# Patient Record
Sex: Female | Born: 1945 | Race: White | Hispanic: No | Marital: Married | Smoking: Former smoker
Health system: Southern US, Community
[De-identification: ages and names within clinical notes are randomized; demographics above are authoritative.]

## PROBLEM LIST (undated history)

## (undated) DIAGNOSIS — E079 Disorder of thyroid, unspecified: Secondary | ICD-10-CM

## (undated) DIAGNOSIS — I10 Essential (primary) hypertension: Secondary | ICD-10-CM

## (undated) HISTORY — DX: Disorder of thyroid, unspecified: E07.9

## (undated) HISTORY — PX: ABDOMINAL HYSTERECTOMY: SHX81

## (undated) HISTORY — PX: OTHER SURGICAL HISTORY: SHX169

## (undated) HISTORY — DX: Essential (primary) hypertension: I10

---

## 1966-03-31 HISTORY — PX: OTHER SURGICAL HISTORY: SHX169

## 1999-05-22 ENCOUNTER — Other Ambulatory Visit: Admission: RE | Admit: 1999-05-22 | Discharge: 1999-05-22 | Payer: Self-pay | Admitting: Urology

## 2013-05-15 ENCOUNTER — Encounter: Payer: Self-pay | Admitting: *Deleted

## 2013-05-15 DIAGNOSIS — E785 Hyperlipidemia, unspecified: Secondary | ICD-10-CM | POA: Insufficient documentation

## 2013-05-15 DIAGNOSIS — R0600 Dyspnea, unspecified: Secondary | ICD-10-CM | POA: Insufficient documentation

## 2013-05-15 DIAGNOSIS — I10 Essential (primary) hypertension: Secondary | ICD-10-CM | POA: Insufficient documentation

## 2013-05-15 DIAGNOSIS — E039 Hypothyroidism, unspecified: Secondary | ICD-10-CM | POA: Insufficient documentation

## 2013-05-15 DIAGNOSIS — H8109 Meniere's disease, unspecified ear: Secondary | ICD-10-CM

## 2015-02-16 ENCOUNTER — Encounter: Payer: Self-pay | Admitting: Gastroenterology

## 2015-04-06 ENCOUNTER — Ambulatory Visit (AMBULATORY_SURGERY_CENTER): Payer: Self-pay

## 2015-04-06 VITALS — Ht 61.5 in | Wt 176.0 lb

## 2015-04-06 DIAGNOSIS — Z1211 Encounter for screening for malignant neoplasm of colon: Secondary | ICD-10-CM

## 2015-04-06 NOTE — Progress Notes (Signed)
Per pt, no allergies to soy or egg products.Pt not taking any weight loss meds or using  O2 at home. 

## 2015-04-19 ENCOUNTER — Ambulatory Visit (AMBULATORY_SURGERY_CENTER): Payer: 59 | Admitting: Gastroenterology

## 2015-04-19 ENCOUNTER — Encounter: Payer: Self-pay | Admitting: Gastroenterology

## 2015-04-19 VITALS — BP 114/66 | HR 73 | Temp 98.1°F | Resp 14 | Ht 61.5 in | Wt 176.0 lb

## 2015-04-19 DIAGNOSIS — K621 Rectal polyp: Secondary | ICD-10-CM

## 2015-04-19 DIAGNOSIS — D122 Benign neoplasm of ascending colon: Secondary | ICD-10-CM

## 2015-04-19 DIAGNOSIS — Z1211 Encounter for screening for malignant neoplasm of colon: Secondary | ICD-10-CM

## 2015-04-19 DIAGNOSIS — D128 Benign neoplasm of rectum: Secondary | ICD-10-CM

## 2015-04-19 DIAGNOSIS — D129 Benign neoplasm of anus and anal canal: Secondary | ICD-10-CM

## 2015-04-19 MED ORDER — SODIUM CHLORIDE 0.9 % IV SOLN: 500.0000 mL | INTRAVENOUS | Status: DC

## 2015-04-19 NOTE — Patient Instructions (Signed)
Impressions/recommendations:  Polyps (handout given)  Repeat colonoscopy pending pathology results.  YOU HAD AN ENDOSCOPIC PROCEDURE TODAY AT THE [facility]:   Refer to the procedure report that was given to you for any specific questions about what was found during the examination.  If the procedure report does not answer your questions, please call your gastroenterologist to clarify.  If you requested that your care partner not be given the details of your procedure findings, then the procedure report has been included in a sealed envelope for you to review at your convenience later.  YOU SHOULD EXPECT: Some feelings of bloating in the abdomen. Passage of more gas than usual.  Walking can help get rid of the air that was put into your GI tract during the procedure and reduce the bloating. If you had a lower endoscopy (such as a colonoscopy or flexible sigmoidoscopy) you may notice spotting of blood in your stool or on the toilet paper. If you underwent a bowel prep for your procedure, you may not have a normal bowel movement for a few days.  Please Note:  You might notice some irritation and congestion in your nose or some drainage.  This is from the oxygen used during your procedure.  There is no need for concern and it should clear up in a day or so.  SYMPTOMS TO REPORT IMMEDIATELY:   Following lower endoscopy (colonoscopy or flexible sigmoidoscopy):  Excessive amounts of blood in the stool  Significant tenderness or worsening of abdominal pains  Swelling of the abdomen that is new, acute  Fever of 100F or higher  For urgent or emergent issues, a gastroenterologist can be reached at any hour by calling [phone].   DIET: Your first meal following the procedure should be a small meal and then it is ok to progress to your normal diet. Heavy or fried foods are harder to digest and may make you feel nauseous or bloated.  Likewise, meals heavy in dairy and vegetables can  increase bloating.  Drink plenty of fluids but you should avoid alcoholic beverages for 24 hours.  ACTIVITY:  You should plan to take it easy for the rest of today and you should NOT DRIVE or use heavy machinery until tomorrow (because of the sedation medicines used during the test).    FOLLOW UP: Our staff will call the number listed on your records the next business day following your procedure to check on you and address any questions or concerns that you may have regarding the information given to you following your procedure. If we do not reach you, we will leave a message.  However, if you are feeling well and you are not experiencing any problems, there is no need to return our call.  We will assume that you have returned to your regular daily activities without incident.  If any biopsies were taken you will be contacted by phone or by letter within the next 1-3 weeks.  Please call us at [phone] if you have not heard about the biopsies in 3 weeks.    SIGNATURES/CONFIDENTIALITY: You and/or your care partner have signed paperwork which will be entered into your electronic medical record.  These signatures attest to the fact that that the information above on your After Visit Summary has been reviewed and is understood.  Full responsibility of the confidentiality of this discharge information lies with you and/or your care-partner. 

## 2015-04-19 NOTE — Op Note (Signed)
[facility]  [address] [place], [address]   COLONOSCOPY PROCEDURE REPORT  PATIENT: Marissa Shah, Marissa Shah  MR#: [id number] BIRTHDATE: 05-03-1945 , 69  yrs. old GENDER: female ENDOSCOPIST: Ladene Artist, MD, [facility] REFERRED BY: Fredrich Romans, MD PROCEDURE DATE:  04/19/2015 PROCEDURE:   Colonoscopy, screening and Colonoscopy with snare polypectomy First Screening Colonoscopy - Avg.  risk and is 50 yrs.  old or older Yes.  Prior Negative Screening - Now for repeat screening. N/A  History of Adenoma - Now for follow-up colonoscopy & has been > or = to 3 yrs.  N/A  Polyps removed today? Yes ASA CLASS:   Class II INDICATIONS:Screening for colonic neoplasia and Colorectal Neoplasm Risk Assessment for this procedure is average risk. MEDICATIONS: Monitored anesthesia care, Propofol 200 mg IV, and lidocaine 40 mg IV DESCRIPTION OF PROCEDURE:   After the risks benefits and alternatives of the procedure were thoroughly explained, informed consent was obtained.  The digital rectal exam revealed no abnormalities of the rectum.   The LB [id number]  endoscope was introduced through the anus and advanced to the cecum, which was identified by both the appendix and ileocecal valve. No adverse events experienced.   The quality of the prep was excellent. (Suprep was used)  The instrument was then slowly withdrawn as the colon was fully examined. Estimated blood loss is zero unless otherwise noted in this procedure report.    COLON FINDINGS: Two sessile polyps measuring 5-7 mm in size were found in the rectum (72mm) and ascending colon (40mm).  Polypectomies were performed with a cold snare.  The resection was complete, the polyp tissue was completely retrieved and sent to histology.   The examination was otherwise normal.  Retroflexed views revealed no abnormalities. The time to cecum = 1.4 Withdrawal time = 9.8   The scope was withdrawn and the procedure  completed. COMPLICATIONS: There were no immediate complications.  ENDOSCOPIC IMPRESSION: 1.   Two sessile polyps in the rectum and ascending colon; polypectomies performed with a cold snare 2.   The examination was otherwise normal  RECOMMENDATIONS: 1.  Await pathology results 2.  Repeat colonoscopy in 5 years if polyp(s) adenomatous; otherwise 10 years  eSigned:  Ladene Artist, MD, [facility] 04/19/2015 8:38 AM

## 2015-04-19 NOTE — Progress Notes (Signed)
Stable to RR 

## 2015-04-19 NOTE — Progress Notes (Signed)
Called to room to assist during endoscopic procedure.  Patient ID and intended procedure confirmed with present staff. Received instructions for my participation in the procedure from the performing physician.  

## 2015-04-20 ENCOUNTER — Telehealth: Payer: Self-pay | Admitting: *Deleted

## 2015-04-20 NOTE — Telephone Encounter (Signed)
  Follow up Call-  Call back number 04/19/2015  Post procedure Call Back phone  # [phone]  Permission to leave phone message Yes     Patient questions:  Message left to call us if necessary.

## 2015-05-01 ENCOUNTER — Encounter: Payer: Self-pay | Admitting: Gastroenterology

## 2019-02-04 ENCOUNTER — Other Ambulatory Visit: Payer: Self-pay

## 2019-02-04 DIAGNOSIS — Z20822 Contact with and (suspected) exposure to covid-19: Secondary | ICD-10-CM

## 2019-02-05 LAB — NOVEL CORONAVIRUS, NAA: SARS-CoV-2, NAA: NOT DETECTED

## 2019-04-25 ENCOUNTER — Encounter: Payer: Self-pay | Admitting: Family Medicine

## 2019-04-25 ENCOUNTER — Other Ambulatory Visit: Payer: Self-pay

## 2019-04-25 ENCOUNTER — Ambulatory Visit (INDEPENDENT_AMBULATORY_CARE_PROVIDER_SITE_OTHER): Payer: PPO | Admitting: Family Medicine

## 2019-04-25 VITALS — BP 140/70 | HR 95 | Temp 98.5°F | Ht 61.5 in | Wt 174.5 lb

## 2019-04-25 DIAGNOSIS — I10 Essential (primary) hypertension: Secondary | ICD-10-CM

## 2019-04-25 DIAGNOSIS — Z131 Encounter for screening for diabetes mellitus: Secondary | ICD-10-CM | POA: Diagnosis not present

## 2019-04-25 DIAGNOSIS — E039 Hypothyroidism, unspecified: Secondary | ICD-10-CM

## 2019-04-25 DIAGNOSIS — E785 Hyperlipidemia, unspecified: Secondary | ICD-10-CM | POA: Diagnosis not present

## 2019-04-25 MED ORDER — BISOPROLOL-HYDROCHLOROTHIAZIDE 2.5-6.25 MG PO TABS: 1.0000 | ORAL_TABLET | Freq: Every day | ORAL | 1 refills | Status: DC

## 2019-04-25 NOTE — Progress Notes (Signed)
Marissa Shah DOB: [birth year] Encounter date: 04/25/2019  This is a 74 y.o. female who presents to establish care. Chief Complaint  Patient presents with  . Establish Care    History of present illness: Had Dr. Sheppard Coil and changed insurance so ended up here where husband goes.   Has been very fortunate and not been to doctor since last December.   Has put on weight. Not getting enough exercise. Gets out of breath easily. Not been active. Gets short of breath. Would love to lose 40lbs. Always wondered if thyroid medication is right for her. Had thyroid removed for goiter in her 20's. Was very skinny until her 58's. Feels like breathing has changed as she has gained weight and become more sedentary. Least exertion makes her short of breath. If she takes deep breath she is fine. Once in awhile feels like heart is racing a bit. Had stress test in past; not sure how long ago.   Has meniere's but if tries to dry up sinus then she gets unbalanced and then affects her hearing and really tries to avoid Meniere's flare. Last flare up for her was about 4 years ago. Can tell when it is coming on. Tries to avoid caffeine, watch salt intake/water retention to help.   HTN: states elevated when she got here. Phone dead, wasn't sure how to get in without being able to call. Has been out of her medication (bisoprolol-hctz) for 3 weeks.  Hypothyroid: has been on synthroid long term.   Hyperlipidemia: was switched from lipitor to crestor.     Past Medical History:  Diagnosis Date  . Hypertension   . Thyroid disease    Past Surgical History:  Procedure Laterality Date  . ABDOMINAL HYSTERECTOMY    . broken arm     left wrist/has screws and plates  . Goiter  1968   removed   Allergies  Allergen Reactions  . Sulfur     Burning in throat!   Current Meds  Medication Sig  . atorvastatin (LIPITOR) 20 MG tablet Take 20 mg by mouth daily.  . bisoprolol-hydrochlorothiazide (ZIAC) 2.5-6.25 MG  tablet Take 1 tablet by mouth daily.  . Calcium Carbonate-Vitamin D (CALTRATE 600+D PO) Take by mouth daily.  Marland Kitchen levothyroxine (SYNTHROID, LEVOTHROID) 88 MCG tablet Take 88 mcg by mouth daily before breakfast.  . lisinopril (PRINIVIL,ZESTRIL) 5 MG tablet Take 5 mg by mouth daily.  . [DISCONTINUED] bisoprolol-hydrochlorothiazide (ZIAC) 2.5-6.25 MG per tablet Take 1 tablet by mouth daily.   Social History   Tobacco Use  . Smoking status: Former Smoker    Quit date: 08/13/2006    Years since quitting: 12.7  . Smokeless tobacco: Never Used  Substance Use Topics  . Alcohol use: No    Alcohol/week: 0.0 standard drinks   Family History  Problem Relation Age of Onset  . Colon cancer Neg Hx      Review of Systems  Constitutional: Negative for chills, fatigue and fever.  Respiratory: Negative for cough, chest tightness, shortness of breath and wheezing.   Cardiovascular: Negative for chest pain, palpitations and leg swelling.    Objective:  BP 140/70 (BP Location: Left Arm, Patient Position: Sitting, Cuff Size: Large)   Pulse 95   Temp 98.5 F (36.9 C) (Temporal)   Ht 5' 1.5" (1.562 m)   Wt 174 lb 8 oz (79.2 kg)   SpO2 99%   BMI 32.44 kg/m   Weight: 174 lb 8 oz (79.2 kg)   BP Readings from Last 3  Encounters:  04/25/19 140/70  04/19/15 114/66   Wt Readings from Last 3 Encounters:  04/25/19 174 lb 8 oz (79.2 kg)  04/19/15 176 lb (79.8 kg)  04/06/15 176 lb (79.8 kg)    Physical Exam Constitutional:      General: She is not in acute distress.    Appearance: She is well-developed.  Cardiovascular:     Rate and Rhythm: Normal rate and regular rhythm.     Heart sounds: Normal heart sounds. No murmur. No friction rub.  Pulmonary:     Effort: Pulmonary effort is normal. No respiratory distress.     Breath sounds: Normal breath sounds. No wheezing or rales.  Musculoskeletal:     Right lower leg: No edema.     Left lower leg: No edema.  Neurological:     Mental Status: She  is alert and oriented to person, place, and time.  Psychiatric:        Behavior: Behavior normal.     Assessment/Plan:  1. Essential hypertension Blood pressure is elevated today, but she has been out of her Ziac.  She will restart this medication and we will see her in follow-up in about 1 month's time.  Blood work prior to next visit. - bisoprolol-hydrochlorothiazide (ZIAC) 2.5-6.25 MG tablet; Take 1 tablet by mouth daily.  Dispense: 90 tablet; Refill: 1 - CBC with Differential/Platelet; Future - Comprehensive metabolic panel; Future  2. Acquired hypothyroidism Continue with current Synthroid. - TSH; Future - T4, free; Future - T3, free; Future  3. Hyperlipidemia, unspecified hyperlipidemia type Continue with Crestor. - Lipid panel; Future  4. Screening for diabetes mellitus - Hemoglobin A1c; Future  Return in about 1 month (around 05/26/2019) for bloodwork prior to next appointment.  Micheline Rough, MD

## 2019-05-03 ENCOUNTER — Other Ambulatory Visit (INDEPENDENT_AMBULATORY_CARE_PROVIDER_SITE_OTHER): Payer: PPO

## 2019-05-03 ENCOUNTER — Other Ambulatory Visit: Payer: Self-pay

## 2019-05-03 DIAGNOSIS — Z131 Encounter for screening for diabetes mellitus: Secondary | ICD-10-CM | POA: Diagnosis not present

## 2019-05-03 DIAGNOSIS — E039 Hypothyroidism, unspecified: Secondary | ICD-10-CM | POA: Diagnosis not present

## 2019-05-03 DIAGNOSIS — I10 Essential (primary) hypertension: Secondary | ICD-10-CM

## 2019-05-03 DIAGNOSIS — E785 Hyperlipidemia, unspecified: Secondary | ICD-10-CM | POA: Diagnosis not present

## 2019-05-03 LAB — CBC WITH DIFFERENTIAL/PLATELET
Basophils Absolute: 0.1 10*3/uL (ref 0.0–0.1)
Basophils Relative: 0.6 % (ref 0.0–3.0)
Eosinophils Absolute: 0.2 10*3/uL (ref 0.0–0.7)
Eosinophils Relative: 2 % (ref 0.0–5.0)
HCT: 41.3 % (ref 36.0–46.0)
Hemoglobin: 14.3 g/dL (ref 12.0–15.0)
Lymphocytes Relative: 28.9 % (ref 12.0–46.0)
Lymphs Abs: 2.7 10*3/uL (ref 0.7–4.0)
MCHC: 34.6 g/dL (ref 30.0–36.0)
MCV: 92.1 fl (ref 78.0–100.0)
Monocytes Absolute: 0.6 10*3/uL (ref 0.1–1.0)
Monocytes Relative: 6.3 % (ref 3.0–12.0)
Neutro Abs: 5.8 10*3/uL (ref 1.4–7.7)
Neutrophils Relative %: 62.2 % (ref 43.0–77.0)
Platelets: 238 10*3/uL (ref 150.0–400.0)
RBC: 4.48 Mil/uL (ref 3.87–5.11)
RDW: 13.2 % (ref 11.5–15.5)
WBC: 9.3 10*3/uL (ref 4.0–10.5)

## 2019-05-03 LAB — LIPID PANEL
Cholesterol: 191 mg/dL (ref 0–200)
HDL: 47.4 mg/dL (ref >39.00)
LDL Cholesterol: 118 mg/dL — ABNORMAL HIGH (ref 0–99)
NonHDL: 143.54
Total CHOL/HDL Ratio: 4
Triglycerides: 129 mg/dL (ref 0.0–149.0)
VLDL: 25.8 mg/dL (ref 0.0–40.0)

## 2019-05-03 LAB — HEMOGLOBIN A1C: Hgb A1c MFr Bld: 5.9 % (ref 4.6–6.5)

## 2019-05-03 LAB — COMPREHENSIVE METABOLIC PANEL
ALT: 12 U/L (ref 0–35)
AST: 14 U/L (ref 0–37)
Albumin: 4.4 g/dL (ref 3.5–5.2)
Alkaline Phosphatase: 84 U/L (ref 39–117)
BUN: 19 mg/dL (ref 6–23)
CO2: 32 mEq/L (ref 19–32)
Calcium: 9.3 mg/dL (ref 8.4–10.5)
Chloride: 98 mEq/L (ref 96–112)
Creatinine, Ser: 0.77 mg/dL (ref 0.40–1.20)
GFR: 73.42 mL/min (ref >60.00)
Glucose, Bld: 108 mg/dL — ABNORMAL HIGH (ref 70–99)
Potassium: 4.8 mEq/L (ref 3.5–5.1)
Sodium: 137 mEq/L (ref 135–145)
Total Bilirubin: 0.5 mg/dL (ref 0.2–1.2)
Total Protein: 7.6 g/dL (ref 6.0–8.3)

## 2019-05-03 LAB — TSH: TSH: 2.4 u[IU]/mL (ref 0.35–4.50)

## 2019-05-03 LAB — T3, FREE: T3, Free: 3.4 pg/mL (ref 2.3–4.2)

## 2019-05-03 LAB — T4, FREE: Free T4: 0.98 ng/dL (ref 0.60–1.60)

## 2019-05-09 DIAGNOSIS — Z20822 Contact with and (suspected) exposure to covid-19: Secondary | ICD-10-CM | POA: Diagnosis not present

## 2019-05-09 DIAGNOSIS — Z20828 Contact with and (suspected) exposure to other viral communicable diseases: Secondary | ICD-10-CM | POA: Diagnosis not present

## 2019-06-09 ENCOUNTER — Other Ambulatory Visit: Payer: Self-pay

## 2019-06-10 ENCOUNTER — Encounter: Payer: Self-pay | Admitting: Family Medicine

## 2019-06-10 ENCOUNTER — Ambulatory Visit (INDEPENDENT_AMBULATORY_CARE_PROVIDER_SITE_OTHER): Payer: PPO | Admitting: Family Medicine

## 2019-06-10 VITALS — BP 118/60 | HR 74 | Temp 98.3°F | Ht 61.5 in | Wt 174.9 lb

## 2019-06-10 DIAGNOSIS — E039 Hypothyroidism, unspecified: Secondary | ICD-10-CM | POA: Diagnosis not present

## 2019-06-10 DIAGNOSIS — I10 Essential (primary) hypertension: Secondary | ICD-10-CM

## 2019-06-10 DIAGNOSIS — E785 Hyperlipidemia, unspecified: Secondary | ICD-10-CM

## 2019-06-10 MED ORDER — ROSUVASTATIN CALCIUM 10 MG PO TABS: 10.0000 mg | ORAL_TABLET | Freq: Every day | ORAL | 3 refills | Status: DC

## 2019-06-10 NOTE — Progress Notes (Signed)
  Marissa Shah DOB: 04/04/1945 Encounter date: 06/10/2019  This is a 74 y.o. female who presents with Chief Complaint  Patient presents with  . Follow-up    History of present illness: HTN: elevated last visit due to stress/out of medication. Doing well with medication.   Had COVID vaccines since last visit.   Allergies  Allergen Reactions  . Sulfur     Burning in throat!   Current Meds  Medication Sig  . bisoprolol-hydrochlorothiazide (ZIAC) 2.5-6.25 MG tablet Take 1 tablet by mouth daily.  . Calcium Carbonate-Vitamin D (CALTRATE 600+D PO) Take by mouth daily.  Marland Kitchen levothyroxine (SYNTHROID, LEVOTHROID) 88 MCG tablet Take 88 mcg by mouth daily before breakfast.  . lisinopril (PRINIVIL,ZESTRIL) 5 MG tablet Take 5 mg by mouth daily.  . [DISCONTINUED] atorvastatin (LIPITOR) 20 MG tablet Take 20 mg by mouth daily.    Review of Systems  Constitutional: Negative for chills, fatigue and fever.  Respiratory: Negative for cough, chest tightness, shortness of breath and wheezing.   Cardiovascular: Negative for chest pain, palpitations and leg swelling.    Objective:  BP 118/60 (BP Location: Left Arm, Patient Position: Sitting, Cuff Size: Normal)   Pulse 74   Temp 98.3 F (36.8 C) (Temporal)   Ht 5' 1.5" (1.562 m)   Wt 174 lb 14.4 oz (79.3 kg)   BMI 32.51 kg/m   Weight: 174 lb 14.4 oz (79.3 kg)   BP Readings from Last 3 Encounters:  06/10/19 118/60  04/25/19 140/70  04/19/15 114/66   Wt Readings from Last 3 Encounters:  06/10/19 174 lb 14.4 oz (79.3 kg)  04/25/19 174 lb 8 oz (79.2 kg)  04/19/15 176 lb (79.8 kg)    Physical Exam Constitutional:      General: She is not in acute distress.    Appearance: She is well-developed.  Cardiovascular:     Rate and Rhythm: Normal rate and regular rhythm.     Heart sounds: Normal heart sounds. No murmur. No friction rub.  Pulmonary:     Effort: Pulmonary effort is normal. No respiratory distress.     Breath sounds: Normal  breath sounds. No wheezing or rales.  Musculoskeletal:     Right lower leg: No edema.     Left lower leg: No edema.  Neurological:     Mental Status: She is alert and oriented to person, place, and time.  Psychiatric:        Behavior: Behavior normal.     Assessment/Plan  1. Hyperlipidemia, unspecified hyperlipidemia type Well controlled. Continue current medications.  - rosuvastatin (CRESTOR) 10 MG tablet; Take 1 tablet (10 mg total) by mouth daily.  Dispense: 90 tablet; Refill: 3  2. Essential hypertension Well controlled on current medications. Continue these.   3. Hypothyroidism, unspecified type Well controlled with recent bloodwork.     Return in about 6 months (around 12/11/2019) for physical exam.    Micheline Rough, MD

## 2019-10-18 ENCOUNTER — Other Ambulatory Visit: Payer: Self-pay | Admitting: Family Medicine

## 2019-10-18 DIAGNOSIS — I10 Essential (primary) hypertension: Secondary | ICD-10-CM

## 2019-12-12 ENCOUNTER — Telehealth: Payer: Self-pay | Admitting: Family Medicine

## 2019-12-12 ENCOUNTER — Other Ambulatory Visit: Payer: Self-pay | Admitting: Family Medicine

## 2019-12-12 ENCOUNTER — Ambulatory Visit: Payer: PPO | Admitting: Family Medicine

## 2019-12-12 DIAGNOSIS — E785 Hyperlipidemia, unspecified: Secondary | ICD-10-CM

## 2019-12-12 DIAGNOSIS — I10 Essential (primary) hypertension: Secondary | ICD-10-CM

## 2019-12-12 MED ORDER — ROSUVASTATIN CALCIUM 10 MG PO TABS: 10.0000 mg | ORAL_TABLET | Freq: Every day | ORAL | 0 refills | Status: DC

## 2019-12-12 MED ORDER — BISOPROLOL-HYDROCHLOROTHIAZIDE 2.5-6.25 MG PO TABS: 1.0000 | ORAL_TABLET | Freq: Every day | ORAL | 0 refills | Status: DC

## 2019-12-12 MED ORDER — LISINOPRIL 5 MG PO TABS: 5.0000 mg | ORAL_TABLET | Freq: Every day | ORAL | 0 refills | Status: DC

## 2019-12-12 MED ORDER — LEVOTHYROXINE SODIUM 88 MCG PO TABS: 88.0000 ug | ORAL_TABLET | Freq: Every day | ORAL | 0 refills | Status: DC

## 2019-12-12 NOTE — Telephone Encounter (Signed)
Noted  

## 2019-12-12 NOTE — Telephone Encounter (Signed)
Pt called in stating that she had a emergency medical out of town and had to cancel her appointment today and would like to see if she can get a refill on all of her Rx's levothyroxine 88 MCG, rosuvastatin 10MG , bisoprolol-hydrochlorothiazide 2.5-6.25 MG and lisinopril 5 MG   Pharm:  [facility] [id number]  [place] [phone].  Pt will call to make another appointment when she gets back in town.

## 2019-12-12 NOTE — Progress Notes (Unsigned)
Marissa Shah DOB: [birth year] Encounter date: 12/12/2019  This is a 74 y.o. female who presents for complete physical   History of present illness/Additional concerns: HTN: HL: crestor 10mg  Hypothyroid:  *** ?last mammogram 03/2016 Last bone density 03/2015 - osteopenia Last colonoscopy 04/2015 - repeat in 03/2020 due to polyp.  Past Medical History:  Diagnosis Date  . Hypertension   . Thyroid disease    Past Surgical History:  Procedure Laterality Date  . ABDOMINAL HYSTERECTOMY    . broken arm     left wrist/has screws and plates  . Goiter  1968   removed   Allergies  Allergen Reactions  . Sulfur     Burning in throat!   No outpatient medications have been marked as taking for the 12/12/19 encounter (Appointment) with Caren Macadam, MD.   Social History   Tobacco Use  . Smoking status: Former Smoker    Quit date: 08/13/2006    Years since quitting: 13.3  . Smokeless tobacco: Never Used  Substance Use Topics  . Alcohol use: No    Alcohol/week: 0.0 standard drinks   Family History  Problem Relation Age of Onset  . Other Mother        died from flu  . Other Father        TB, multiple health issues  . Hearing loss Father   . Hypertension Brother   . Hearing loss Brother   . Alcohol abuse Brother   . Alcohol abuse Brother   . Colon cancer Neg Hx      Review of Systems  CBC:  Lab Results  Component Value Date   WBC 9.3 05/03/2019   HGB 14.3 05/03/2019   HCT 41.3 05/03/2019   MCHC 34.6 05/03/2019   RDW 13.2 05/03/2019   PLT 238.0 05/03/2019   CMP: Lab Results  Component Value Date   NA 137 05/03/2019   K 4.8 05/03/2019   CL 98 05/03/2019   CO2 32 05/03/2019   GLUCOSE 108 (H) 05/03/2019   BUN 19 05/03/2019   CREATININE 0.77 05/03/2019   CALCIUM 9.3 05/03/2019   PROT 7.6 05/03/2019   BILITOT 0.5 05/03/2019   ALKPHOS 84 05/03/2019   ALT 12 05/03/2019   AST 14 05/03/2019   LIPID: Lab Results  Component Value Date   CHOL 191  05/03/2019   TRIG 129.0 05/03/2019   HDL 47.40 05/03/2019   LDLCALC 118 (H) 05/03/2019    Objective:  There were no vitals taken for this visit.      BP Readings from Last 3 Encounters:  06/10/19 118/60  04/25/19 140/70  04/19/15 114/66   Wt Readings from Last 3 Encounters:  06/10/19 174 lb 14.4 oz (79.3 kg)  04/25/19 174 lb 8 oz (79.2 kg)  04/19/15 176 lb (79.8 kg)    Physical Exam  Assessment/Plan: Health Maintenance Due  Topic Date Due  . Hepatitis C Screening  Never done  . COVID-19 Vaccine (1) Never done  . TETANUS/TDAP  Never done  . DEXA SCAN  Never done  . PNA vac Low Risk Adult (1 of 2 - PCV13) Never done  . MAMMOGRAM  04/01/2019  . INFLUENZA VACCINE  10/30/2019   Health Maintenance reviewed - {health maintenance:315237}.  There are no diagnoses linked to this encounter.  No follow-ups on file.  Micheline Rough, MD

## 2019-12-12 NOTE — Telephone Encounter (Signed)
I sent lisinopril to requested pharmacy.

## 2020-01-11 ENCOUNTER — Other Ambulatory Visit: Payer: Self-pay

## 2020-01-11 ENCOUNTER — Ambulatory Visit (INDEPENDENT_AMBULATORY_CARE_PROVIDER_SITE_OTHER): Payer: PPO | Admitting: Family Medicine

## 2020-01-11 ENCOUNTER — Encounter: Payer: Self-pay | Admitting: Family Medicine

## 2020-01-11 VITALS — BP 118/60 | HR 72 | Temp 98.1°F | Ht 61.0 in | Wt 171.9 lb

## 2020-01-11 DIAGNOSIS — I10 Essential (primary) hypertension: Secondary | ICD-10-CM

## 2020-01-11 DIAGNOSIS — Z1231 Encounter for screening mammogram for malignant neoplasm of breast: Secondary | ICD-10-CM

## 2020-01-11 DIAGNOSIS — E039 Hypothyroidism, unspecified: Secondary | ICD-10-CM | POA: Diagnosis not present

## 2020-01-11 DIAGNOSIS — E785 Hyperlipidemia, unspecified: Secondary | ICD-10-CM | POA: Diagnosis not present

## 2020-01-11 DIAGNOSIS — Z23 Encounter for immunization: Secondary | ICD-10-CM

## 2020-01-11 DIAGNOSIS — E2839 Other primary ovarian failure: Secondary | ICD-10-CM | POA: Diagnosis not present

## 2020-01-11 DIAGNOSIS — Z0000 Encounter for general adult medical examination without abnormal findings: Secondary | ICD-10-CM

## 2020-01-11 MED ORDER — LEVOTHYROXINE SODIUM 88 MCG PO TABS
88.0000 ug | ORAL_TABLET | Freq: Every day | ORAL | 1 refills | Status: DC
Start: 2020-01-11 — End: 2020-06-26

## 2020-01-11 MED ORDER — BISOPROLOL-HYDROCHLOROTHIAZIDE 2.5-6.25 MG PO TABS: 1.0000 | ORAL_TABLET | Freq: Every day | ORAL | 1 refills | Status: DC

## 2020-01-11 MED ORDER — ROSUVASTATIN CALCIUM 10 MG PO TABS: 10.0000 mg | ORAL_TABLET | Freq: Every day | ORAL | 1 refills | Status: DC

## 2020-01-11 MED ORDER — SHINGRIX 50 MCG/0.5ML IM SUSR: 0.5000 mL | Freq: Once | INTRAMUSCULAR | 0 refills | Status: AC

## 2020-01-11 MED ORDER — LISINOPRIL 5 MG PO TABS
5.0000 mg | ORAL_TABLET | Freq: Every day | ORAL | 1 refills | Status: DC
Start: 2020-01-11 — End: 2020-06-26

## 2020-01-11 NOTE — Patient Instructions (Signed)
Zoster Vaccine, Recombinant injection What is this medicine? ZOSTER VACCINE (ZOS ter vak SEEN) is used to prevent shingles in adults 74 years old and over. This vaccine is not used to treat shingles or nerve pain from shingles. This medicine may be used for other purposes; ask your health care provider or pharmacist if you have questions. COMMON BRAND NAME(S): SHINGRIX What should I tell my health care provider before I take this medicine? They need to know if you have any of these conditions:  blood disorders or disease  cancer like leukemia or lymphoma  immune system problems or therapy  an unusual or allergic reaction to vaccines, other medications, foods, dyes, or preservatives  pregnant or trying to get pregnant  breast-feeding How should I use this medicine? This vaccine is for injection in a muscle. It is given by a health care professional. Talk to your pediatrician regarding the use of this medicine in children. This medicine is not approved for use in children. Overdosage: If you think you have taken too much of this medicine contact a poison control center or emergency room at once. NOTE: This medicine is only for you. Do not share this medicine with others. What if I miss a dose? Keep appointments for follow-up (booster) doses as directed. It is important not to miss your dose. Call your doctor or health care professional if you are unable to keep an appointment. What may interact with this medicine?  medicines that suppress your immune system  medicines to treat cancer  steroid medicines like prednisone or cortisone This list may not describe all possible interactions. Give your health care provider a list of all the medicines, herbs, non-prescription drugs, or dietary supplements you use. Also tell them if you smoke, drink alcohol, or use illegal drugs. Some items may interact with your medicine. What should I watch for while using this medicine? Visit your doctor for  regular check ups. This vaccine, like all vaccines, may not fully protect everyone. What side effects may I notice from receiving this medicine? Side effects that you should report to your doctor or health care professional as soon as possible:  allergic reactions like skin rash, itching or hives, swelling of the face, lips, or tongue  breathing problems Side effects that usually do not require medical attention (report these to your doctor or health care professional if they continue or are bothersome):  chills  headache  fever  nausea, vomiting  redness, warmth, pain, swelling or itching at site where injected  tiredness This list may not describe all possible side effects. Call your doctor for medical advice about side effects. You may report side effects to FDA at 1-800-FDA-1088. Where should I keep my medicine? This vaccine is only given in a clinic, pharmacy, doctor's office, or other health care setting and will not be stored at home. NOTE: This sheet is a summary. It may not cover all possible information. If you have questions about this medicine, talk to your doctor, pharmacist, or health care provider.  2020 Elsevier/Gold Standard (2016-10-27 13:20:30)  

## 2020-01-11 NOTE — Progress Notes (Signed)
Marissa Shah DOB: [birth year] Encounter date: 01/11/2020  This is a 74 y.o. female who presents for complete physical   History of present illness/Additional concerns:  Recently lost son in law to brain cancer at home. This was a challenge. Only issue she has is sinuses currently. Has been healthy. Ears do get worse when sinuses are bad, but today she is doing ok. If she takes medication and dries out sinuses too much she does worse.   HTN: Ziac 2.5-6.25 mg daily. Not checking at home, but good about taking medications. No headaches/light headedness.  HL: crestor 10mg ; does well with this; no cramps.  Hypothyroid: Synthroid 88 mcg daily  ?last mammogram 03/2016 Last bone density 03/2015 - osteopenia Last colonoscopy 04/2015 - repeat in 03/2020 due to polyp.  Past Medical History:  Diagnosis Date   Hypertension    Thyroid disease    Past Surgical History:  Procedure Laterality Date   ABDOMINAL HYSTERECTOMY     broken arm     left wrist/has screws and plates   Goiter  [id number]   removed   Allergies  Allergen Reactions   Sulfur     Burning in throat!   Current Meds  Medication Sig   bisoprolol-hydrochlorothiazide (ZIAC) 2.5-6.25 MG tablet Take 1 tablet by mouth daily.   Calcium Carbonate-Vitamin D (CALTRATE 600+D PO) Take by mouth daily.   levothyroxine (SYNTHROID) 88 MCG tablet Take 1 tablet (88 mcg total) by mouth daily before breakfast.   lisinopril (ZESTRIL) 5 MG tablet Take 1 tablet (5 mg total) by mouth daily.   rosuvastatin (CRESTOR) 10 MG tablet Take 1 tablet (10 mg total) by mouth daily.   [DISCONTINUED] bisoprolol-hydrochlorothiazide (ZIAC) 2.5-6.25 MG tablet Take 1 tablet by mouth daily.   [DISCONTINUED] levothyroxine (SYNTHROID) 88 MCG tablet Take 1 tablet (88 mcg total) by mouth daily before breakfast.   [DISCONTINUED] lisinopril (ZESTRIL) 5 MG tablet Take 1 tablet (5 mg total) by mouth daily.   [DISCONTINUED] rosuvastatin (CRESTOR) 10 MG tablet  Take 1 tablet (10 mg total) by mouth daily.   Social History   Tobacco Use   Smoking status: Former Smoker    Quit date: 08/13/2006    Years since quitting: 13.4   Smokeless tobacco: Never Used  Substance Use Topics   Alcohol use: No    Alcohol/week: 0.0 standard drinks   Family History  Problem Relation Age of Onset   Other Mother        died from flu   Other Father        TB, multiple health issues   Hearing loss Father    Hypertension Brother    Hearing loss Brother    Alcohol abuse Brother    Alcohol abuse Brother    Colon cancer Neg Hx      Review of Systems  Constitutional: Negative for activity change, appetite change, chills, fatigue, fever and unexpected weight change.  HENT: Negative for congestion, ear pain, hearing loss, sinus pressure, sinus pain, sore throat and trouble swallowing.   Eyes: Negative for pain and visual disturbance.  Respiratory: Negative for cough, chest tightness, shortness of breath and wheezing.   Cardiovascular: Negative for chest pain, palpitations and leg swelling.  Gastrointestinal: Negative for abdominal pain, blood in stool, constipation, diarrhea, nausea and vomiting.  Genitourinary: Negative for difficulty urinating and menstrual problem.  Musculoskeletal: Negative for arthralgias and back pain.  Skin: Negative for rash.  Neurological: Negative for dizziness, weakness, numbness and headaches.  Hematological: Negative for adenopathy. Does not  bruise/bleed easily.  Psychiatric/Behavioral: Negative for sleep disturbance and suicidal ideas. The patient is not nervous/anxious.     CBC:  Lab Results  Component Value Date   WBC 9.3 05/03/2019   HGB 14.3 05/03/2019   HCT 41.3 05/03/2019   MCHC 34.6 05/03/2019   RDW 13.2 05/03/2019   PLT 238.0 05/03/2019   CMP: Lab Results  Component Value Date   NA 137 05/03/2019   K 4.8 05/03/2019   CL 98 05/03/2019   CO2 32 05/03/2019   GLUCOSE 108 (H) 05/03/2019   BUN 19  05/03/2019   CREATININE 0.77 05/03/2019   CALCIUM 9.3 05/03/2019   PROT 7.6 05/03/2019   BILITOT 0.5 05/03/2019   ALKPHOS 84 05/03/2019   ALT 12 05/03/2019   AST 14 05/03/2019   LIPID: Lab Results  Component Value Date   CHOL 191 05/03/2019   TRIG 129.0 05/03/2019   HDL 47.40 05/03/2019   LDLCALC 118 (H) 05/03/2019    Objective:  BP 118/60 (BP Location: Left Arm, Patient Position: Sitting, Cuff Size: Large)    Pulse 72    Temp 98.1 F (36.7 C) (Oral)    Ht 5\' 1"  (1.549 m)    Wt 171 lb 14.4 oz (78 kg)    BMI 32.48 kg/m   Weight: 171 lb 14.4 oz (78 kg)   BP Readings from Last 3 Encounters:  01/11/20 118/60  06/10/19 118/60  04/25/19 140/70   Wt Readings from Last 3 Encounters:  01/11/20 171 lb 14.4 oz (78 kg)  06/10/19 174 lb 14.4 oz (79.3 kg)  04/25/19 174 lb 8 oz (79.2 kg)    Physical Exam Constitutional:      General: She is not in acute distress.    Appearance: She is well-developed.  HENT:     Head: Normocephalic and atraumatic.     Right Ear: External ear normal.     Left Ear: External ear normal.     Mouth/Throat:     Pharynx: No oropharyngeal exudate.  Eyes:     Conjunctiva/sclera: Conjunctivae normal.     Pupils: Pupils are equal, round, and reactive to light.  Neck:     Thyroid: No thyromegaly.  Cardiovascular:     Rate and Rhythm: Normal rate and regular rhythm.     Heart sounds: Normal heart sounds. No murmur heard.  No friction rub. No gallop.   Pulmonary:     Effort: Pulmonary effort is normal.     Breath sounds: Normal breath sounds.  Abdominal:     General: Bowel sounds are normal. There is no distension.     Palpations: Abdomen is soft. There is no mass.     Tenderness: There is no abdominal tenderness. There is no guarding.     Hernia: No hernia is present.  Musculoskeletal:        General: No tenderness or deformity. Normal range of motion.     Cervical back: Normal range of motion and neck supple.  Lymphadenopathy:     Cervical: No  cervical adenopathy.  Skin:    General: Skin is warm and dry.     Findings: No rash.  Neurological:     Mental Status: She is alert and oriented to person, place, and time.     Deep Tendon Reflexes: Reflexes normal.     Reflex Scores:      Tricep reflexes are 2+ on the right side and 2+ on the left side.      Bicep reflexes are 2+ on the  right side and 2+ on the left side.      Brachioradialis reflexes are 2+ on the right side and 2+ on the left side.      Patellar reflexes are 2+ on the right side and 2+ on the left side. Psychiatric:        Speech: Speech normal.        Behavior: Behavior normal.        Thought Content: Thought content normal.     Assessment/Plan: Health Maintenance Due  Topic Date Due   TETANUS/TDAP  Never done   DEXA SCAN  Never done   MAMMOGRAM  04/01/2019   Health Maintenance reviewed.  1. Preventative health care We discussed importance of regular exercise. Encouraged her to think about walking daily.  - Hepatitis C antibody; Future - Pneumococcal polysaccharide vaccine 23-valent greater than or equal to 2yo subcutaneous/IM - Hepatitis C antibody  2. Primary hypertension Well controlled on current ziac dose 2.5-6.25 mg, lisinopril 5mg  daily. Continue these. - CBC with Differential/Platelet; Future - Comprehensive metabolic panel; Future - Comprehensive metabolic panel - CBC with Differential/Platelet  3. Hypothyroidism, unspecified type Continue current synthroid 54mcg dose; will recheck bloodwork today - TSH; Future - TSH  4. Hyperlipidemia, unspecified hyperlipidemia type Has been well controlled on Crestor.  Continue current dosing of 10 mg daily.  We will recheck levels today. - Lipid panel; Future - Lipid panel - rosuvastatin (CRESTOR) 10 MG tablet; Take 1 tablet (10 mg total) by mouth daily.  Dispense: 90 tablet; Refill: 1  5. Estrogen deficiency Prior bone density showed osteopenia.  We will get a recheck for comparison. - DG  Bone Density; Future  6. Encounter for screening mammogram for malignant neoplasm of breast - MM DIGITAL SCREENING BILATERAL; Future  7. Need for immunization against influenza - Flu Vaccine QUAD High Dose(Fluad)   Return in about 6 months (around 07/11/2020) for Chronic condition visit.  Micheline Rough, MD

## 2020-01-12 LAB — COMPREHENSIVE METABOLIC PANEL
AG Ratio: 1.4 (calc) (ref 1.0–2.5)
ALT: 10 U/L (ref 6–29)
AST: 15 U/L (ref 10–35)
Albumin: 4.3 g/dL (ref 3.6–5.1)
Alkaline phosphatase (APISO): 76 U/L (ref 37–153)
BUN: 19 mg/dL (ref 7–25)
CO2: 28 mmol/L (ref 20–32)
Calcium: 9.5 mg/dL (ref 8.6–10.4)
Chloride: 98 mmol/L (ref 98–110)
Creat: 0.7 mg/dL (ref 0.60–0.93)
Globulin: 3 g/dL (calc) (ref 1.9–3.7)
Glucose, Bld: 93 mg/dL (ref 65–99)
Potassium: 5 mmol/L (ref 3.5–5.3)
Sodium: 138 mmol/L (ref 135–146)
Total Bilirubin: 0.4 mg/dL (ref 0.2–1.2)
Total Protein: 7.3 g/dL (ref 6.1–8.1)

## 2020-01-12 LAB — CBC WITH DIFFERENTIAL/PLATELET
Absolute Monocytes: 470 cells/uL (ref 200–950)
Basophils Absolute: 41 cells/uL (ref 0–200)
Basophils Relative: 0.5 %
Eosinophils Absolute: 122 cells/uL (ref 15–500)
Eosinophils Relative: 1.5 %
HCT: 40.9 % (ref 35.0–45.0)
Hemoglobin: 13.7 g/dL (ref 11.7–15.5)
Lymphs Abs: 1968 cells/uL (ref 850–3900)
MCH: 31.4 pg (ref 27.0–33.0)
MCHC: 33.5 g/dL (ref 32.0–36.0)
MCV: 93.8 fL (ref 80.0–100.0)
MPV: 11.3 fL (ref 7.5–12.5)
Monocytes Relative: 5.8 %
Neutro Abs: 5500 cells/uL (ref 1500–7800)
Neutrophils Relative %: 67.9 %
Platelets: 241 10*3/uL (ref 140–400)
RBC: 4.36 10*6/uL (ref 3.80–5.10)
RDW: 12.3 % (ref 11.0–15.0)
Total Lymphocyte: 24.3 %
WBC: 8.1 10*3/uL (ref 3.8–10.8)

## 2020-01-12 LAB — LIPID PANEL
Cholesterol: 161 mg/dL (ref <200)
HDL: 50 mg/dL (ref >=50)
LDL Cholesterol (Calc): 92 mg/dL (calc)
Non-HDL Cholesterol (Calc): 111 mg/dL (calc) (ref <130)
Total CHOL/HDL Ratio: 3.2 (calc) (ref <5.0)
Triglycerides: 97 mg/dL (ref <150)

## 2020-01-12 LAB — TSH: TSH: 1.02 mIU/L (ref 0.40–4.50)

## 2020-01-12 LAB — HEPATITIS C ANTIBODY
Hepatitis C Ab: NONREACTIVE
SIGNAL TO CUT-OFF: 0.02 (ref <1.00)

## 2020-04-11 ENCOUNTER — Telehealth: Payer: Self-pay | Admitting: Family Medicine

## 2020-04-11 NOTE — Telephone Encounter (Signed)
Left message for patient to call back and schedule Medicare Annual Wellness Visit (AWV) either virtually or in office.   Last AWV no information please schedule at anytime with [facility] Nurse Health Advisor 1 or 2   This should be a 45 minute visit. 

## 2020-04-26 ENCOUNTER — Encounter: Payer: Self-pay | Admitting: Family Medicine

## 2020-05-08 ENCOUNTER — Encounter: Payer: Self-pay | Admitting: Gastroenterology

## 2020-06-19 ENCOUNTER — Telehealth: Payer: Self-pay | Admitting: Family Medicine

## 2020-06-19 NOTE — Telephone Encounter (Signed)
Left message asking patient to call office Please reschedule 06/27/20 appointment  Otila Kluver will be out of the office

## 2020-06-25 NOTE — Progress Notes (Signed)
Subjective:   Marissa Shah is a 75 y.o. female who presents for an Initial Medicare Annual Wellness Visit.  I connected with Diana Eves today by telephone and verified that I am speaking with the correct person using two identifiers. Location patient: home Location provider: work Persons participating in the virtual visit: patient, provider.   I discussed the limitations, risks, security and privacy concerns of performing an evaluation and management service by telephone and the availability of in person appointments. I also discussed with the patient that there may be a patient responsible charge related to this service. The patient expressed understanding and verbally consented to this telephonic visit.    Interactive audio and video telecommunications were attempted between this provider and patient, however failed, due to patient having technical difficulties OR patient did not have access to video capability.  We continued and completed visit with audio only.      Review of Systems    N/A  Cardiac Risk Factors include: advanced age (>35men, >34 women);dyslipidemia;hypertension     Objective:    Today's Vitals   There is no height or weight on file to calculate BMI.  Advanced Directives 06/26/2020  Does Patient Have a Medical Advance Directive? No  Would patient like information on creating a medical advance directive? No - Patient declined    Current Medications (verified) Outpatient Encounter Medications as of 06/26/2020  Medication Sig  . bisoprolol-hydrochlorothiazide (ZIAC) 2.5-6.25 MG tablet Take 1 tablet by mouth daily.  . Calcium Carbonate-Vitamin D (CALTRATE 600+D PO) Take by mouth daily.  Marland Kitchen levothyroxine (SYNTHROID) 88 MCG tablet Take 1 tablet (88 mcg total) by mouth daily before breakfast.  . lisinopril (ZESTRIL) 5 MG tablet Take 1 tablet (5 mg total) by mouth daily.  . rosuvastatin (CRESTOR) 10 MG tablet Take 1 tablet (10 mg total) by mouth daily.    No facility-administered encounter medications on file as of 06/26/2020.    Allergies (verified) Elemental sulfur and Sulfamethoxazole   History: Past Medical History:  Diagnosis Date  . Hypertension   . Thyroid disease    Past Surgical History:  Procedure Laterality Date  . ABDOMINAL HYSTERECTOMY    . broken arm     left wrist/has screws and plates  . Goiter  1968   removed   Family History  Problem Relation Age of Onset  . Other Mother        died from flu  . Other Father        TB, multiple health issues  . Hearing loss Father   . Hypertension Brother   . Hearing loss Brother   . Alcohol abuse Brother   . Alcohol abuse Brother   . Colon cancer Neg Hx    Social History   Socioeconomic History  . Marital status: Married    Spouse name: Not on file  . Number of children: Not on file  . Years of education: Not on file  . Highest education level: Not on file  Occupational History  . Not on file  Tobacco Use  . Smoking status: Former Smoker    Packs/day: 2.50    Years: 25.00    Pack years: 62.50    Quit date: 08/13/2006    Years since quitting: 13.8  . Smokeless tobacco: Never Used  Substance and Sexual Activity  . Alcohol use: No    Alcohol/week: 0.0 standard drinks  . Drug use: No  . Sexual activity: Not on file  Other Topics Concern  . Not  on file  Social History Narrative  . Not on file   Social Determinants of Health   Financial Resource Strain: Low Risk   . Difficulty of Paying Living Expenses: Not hard at all  Food Insecurity: No Food Insecurity  . Worried About Charity fundraiser in the Last Year: Never true  . Ran Out of Food in the Last Year: Never true  Transportation Needs: No Transportation Needs  . Lack of Transportation (Medical): No  . Lack of Transportation (Non-Medical): No  Physical Activity: Inactive  . Days of Exercise per Week: 0 days  . Minutes of Exercise per Session: 0 min  Stress: No Stress Concern Present  . Feeling  of Stress : Not at all  Social Connections: Moderately Isolated  . Frequency of Communication with Friends and Family: More than three times a week  . Frequency of Social Gatherings with Friends and Family: Once a week  . Attends Religious Services: Never  . Active Member of Clubs or Organizations: No  . Attends Archivist Meetings: Never  . Marital Status: Married    Tobacco Counseling Counseling given: Not Answered   Clinical Intake:  Pre-visit preparation completed: Yes  Pain : No/denies pain     Nutritional Risks: None Diabetes: No  How often do you need to have someone help you when you read instructions, pamphlets, or other written materials from your doctor or pharmacy?: 1 - Never What is the last grade level you completed in school?: some college  Diabetic?No   Interpreter Needed?: No  Information entered by :: [facility] of Daily Living In your present state of health, do you have any difficulty performing the following activities: 06/26/2020  Hearing? 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 N  Difficulty concentrating or making decisions? N  Walking or climbing stairs? Y  Comment gets sob when climbing stairs  Dressing or bathing? N  Doing errands, shopping? N  Preparing Food and eating ? N  Using the Toilet? N  In the past six months, have you accidently leaked urine? N  Do you have problems with loss of bowel control? N  Managing your Medications? N  Managing your Finances? N  Housekeeping or managing your Housekeeping? N  Some recent data might be hidden    Patient Care Team: Caren Macadam, MD as PCP - General (Family Medicine)  Indicate any recent Medical Services you may have received from other than [facility] providers in the past year (date may be approximate).     Assessment:   This is a routine wellness examination for Marissa Shah.  Hearing/Vision screen  Hearing Screening   125Hz   250Hz  500Hz  1000Hz  2000Hz  3000Hz  4000Hz  6000Hz  8000Hz   Right ear:           Left ear:           Vision Screening Comments: Has not had eye exam in 2 years, wears glasses.   Dietary issues and exercise activities discussed: Current Exercise Habits: The patient does not participate in regular exercise at present, Exercise limited by: None identified  Goals    . Exercise 3x per week (30 min per time)     I would like to walk outside when the weather gets warm    . Weight (lb) < 150 lb (68 kg)      Depression Screen PHQ 2/9 Scores 06/26/2020 01/11/2020  PHQ - 2 Score 0 0  PHQ- 9 Score 0 -  Fall Risk Fall Risk  06/26/2020 01/11/2020  Falls in the past year? 0 0  Number falls in past yr: 0 0  Injury with Fall? 0 -  Risk for fall due to : No Fall Risks -  Follow up Falls evaluation completed;Falls prevention discussed -    FALL RISK PREVENTION PERTAINING TO THE HOME:  Any stairs in or around the home? No  If so, are there any without handrails? No  Home free of loose throw rugs in walkways, pet beds, electrical cords, etc? Yes  Adequate lighting in your home to reduce risk of falls? Yes   ASSISTIVE DEVICES UTILIZED TO PREVENT FALLS:  Life alert? No  Use of a cane, walker or w/c? No  Grab bars in the bathroom? Yes  Shower chair or bench in shower? No  Elevated toilet seat or a handicapped toilet? No    Cognitive Function:   Normal cognitive status assessed by direct observation by this Nurse Health Advisor. No abnormalities found.        Immunizations Immunization History  Administered Date(s) Administered  . Fluad Quad(high Dose 65+) 01/11/2020  . Influenza, High Dose Seasonal PF 01/29/2019  . Influenza-Unspecified 01/30/2019  . Moderna Sars-Covid-2 Vaccination 05/05/2019, 06/01/2019  . Pneumococcal Polysaccharide-23 01/11/2020    TDAP status: Due, Education has been provided regarding the importance of this vaccine. Advised may receive this vaccine at local  pharmacy or Health Dept. Aware to provide a copy of the vaccination record if obtained from local pharmacy or Health Dept. Verbalized acceptance and understanding.  Flu Vaccine status: Up to date  Pneumococcal vaccine status: Up to date  Covid-19 vaccine status: Completed vaccines  Qualifies for Shingles Vaccine? Yes   Zostavax completed No   Shingrix Completed?: No.    Education has been provided regarding the importance of this vaccine. Patient has been advised to call insurance company to determine out of pocket expense if they have not yet received this vaccine. Advised may also receive vaccine at local pharmacy or Health Dept. Verbalized acceptance and understanding.  Screening Tests Health Maintenance  Topic Date Due  . TETANUS/TDAP  Never done  . DEXA SCAN  Never done  . MAMMOGRAM  03/31/2018  . COVID-19 Vaccine (3 - Booster for Moderna series) 12/02/2019  . COLONOSCOPY (Pts 45-32yrs Insurance coverage will need to be confirmed)  04/18/2020  . PNA vac Low Risk Adult (2 of 2 - PCV13) 01/10/2021  . INFLUENZA VACCINE  Completed  . Hepatitis C Screening  Completed  . HPV VACCINES  Aged Out    Health Maintenance  Health Maintenance Due  Topic Date Due  . TETANUS/TDAP  Never done  . DEXA SCAN  Never done  . MAMMOGRAM  03/31/2018  . COVID-19 Vaccine (3 - Booster for Moderna series) 12/02/2019  . COLONOSCOPY (Pts 45-104yrs Insurance coverage will need to be confirmed)  04/18/2020    Colorectal cancer screening: Referral to GI placed 06/26/2020. Pt aware the office will call re: appt.  Mammogram status: Ordered 06/26/2020. Pt provided with contact info and advised to call to schedule appt.   Bone Density status: Ordered 01/11/2020. Pt provided with contact info and advised to call to schedule appt.  Lung Cancer Screening: (Low Dose CT Chest recommended if Age 20-80 years, 30 pack-year currently smoking OR have quit w/in 15years.) does qualify.   Lung Cancer Screening  Referral: Yes  Additional Screening:  Hepatitis C Screening: does qualify; Completed 01/11/2020   Vision Screening: Recommended annual ophthalmology exams for early  detection of glaucoma and other disorders of the eye. Is the patient up to date with their annual eye exam?  No  Who is the provider or what is the name of the office in which the patient attends annual eye exams? [facility]  If pt is not established with a provider, would they like to be referred to a provider to establish care? No .   Dental Screening: Recommended annual dental exams for proper oral hygiene  Community Resource Referral / Chronic Care Management: CRR required this visit?  No   CCM required this visit?  No      Plan:     I have personally reviewed and noted the following in the patient's chart:   . Medical and social history . Use of alcohol, tobacco or illicit drugs  . Current medications and supplements . Functional ability and status . Nutritional status . Physical activity . Advanced directives . List of other physicians . Hospitalizations, surgeries, and ER visits in previous 12 months . Vitals . Screenings to include cognitive, depression, and falls . Referrals and appointments  In addition, I have reviewed and discussed with patient certain preventive protocols, quality metrics, and best practice recommendations. A written personalized care plan for preventive services as well as general preventive health recommendations were provided to patient.     Ofilia Neas, LPN   2/99/2426   Nurse Notes: Patient requested 30 day refills on her medications until office visit with Dr. Ethlyn Gallery on 07/23/2020

## 2020-06-26 ENCOUNTER — Ambulatory Visit (INDEPENDENT_AMBULATORY_CARE_PROVIDER_SITE_OTHER): Payer: PPO

## 2020-06-26 ENCOUNTER — Encounter: Payer: Self-pay | Admitting: Family Medicine

## 2020-06-26 ENCOUNTER — Other Ambulatory Visit: Payer: Self-pay

## 2020-06-26 DIAGNOSIS — Z1211 Encounter for screening for malignant neoplasm of colon: Secondary | ICD-10-CM

## 2020-06-26 DIAGNOSIS — Z0000 Encounter for general adult medical examination without abnormal findings: Secondary | ICD-10-CM

## 2020-06-26 DIAGNOSIS — Z780 Asymptomatic menopausal state: Secondary | ICD-10-CM

## 2020-06-26 DIAGNOSIS — Z1231 Encounter for screening mammogram for malignant neoplasm of breast: Secondary | ICD-10-CM

## 2020-06-26 DIAGNOSIS — Z87891 Personal history of nicotine dependence: Secondary | ICD-10-CM

## 2020-06-26 DIAGNOSIS — I10 Essential (primary) hypertension: Secondary | ICD-10-CM | POA: Diagnosis not present

## 2020-06-26 DIAGNOSIS — E785 Hyperlipidemia, unspecified: Secondary | ICD-10-CM

## 2020-06-26 MED ORDER — LEVOTHYROXINE SODIUM 88 MCG PO TABS: 88.0000 ug | ORAL_TABLET | Freq: Every day | ORAL | 0 refills | Status: DC

## 2020-06-26 MED ORDER — BISOPROLOL-HYDROCHLOROTHIAZIDE 2.5-6.25 MG PO TABS: 1.0000 | ORAL_TABLET | Freq: Every day | ORAL | 0 refills | Status: DC

## 2020-06-26 MED ORDER — LISINOPRIL 5 MG PO TABS: 5.0000 mg | ORAL_TABLET | Freq: Every day | ORAL | 0 refills | Status: DC

## 2020-06-26 MED ORDER — ROSUVASTATIN CALCIUM 10 MG PO TABS: 10.0000 mg | ORAL_TABLET | Freq: Every day | ORAL | 0 refills | Status: DC

## 2020-06-26 NOTE — Patient Instructions (Signed)
Marissa Shah , Thank you for taking time to come for your Medicare Wellness Visit. I appreciate your ongoing commitment to your health goals. Please review the following plan we discussed and let me know if I can assist you in the future.   Screening recommendations/referrals: Colonoscopy: Currently due orders placed this visit Mammogram: Currently due, orders placed this visit. Bone Density: Currently due, orders placed this visit Recommended yearly ophthalmology/optometry visit for glaucoma screening and checkup Recommended yearly dental visit for hygiene and checkup  Vaccinations: Influenza vaccine: Up to date, next due fall 2022  Pneumococcal vaccine: Up to date, Prevnar 20 due 01/10/2021 Tdap vaccine: Currently due, you may await and injury to receive  Shingles vaccine: Currently due for Shingrix, if you would like to receive we recommend that you do so at your local pharmacy    Advanced directives: Advance directive discussed with you today. Even though you declined this today please call our office should you change your mind and we can give you the proper paperwork for you to fill out.   Conditions/risks identified: Please try to incorporate some physical activity such as walking at least 3x per week for at least 30 minutes   Next appointment: 07/23/2020 @ 8:30 am with Dr. Ethlyn Gallery    Preventive Care 65 Years and Older, Female Preventive care refers to lifestyle choices and visits with your health care provider that can promote health and wellness. What does preventive care include?  A yearly physical exam. This is also called an annual well check.  Dental exams once or twice a year.  Routine eye exams. Ask your health care provider how often you should have your eyes checked.  Personal lifestyle choices, including:  Daily care of your teeth and gums.  Regular physical activity.  Eating a healthy diet.  Avoiding tobacco and drug use.  Limiting alcohol  use.  Practicing safe sex.  Taking low-dose aspirin every day.  Taking vitamin and mineral supplements as recommended by your health care provider. What happens during an annual well check? The services and screenings done by your health care provider during your annual well check will depend on your age, overall health, lifestyle risk factors, and family history of disease. Counseling  Your health care provider may ask you questions about your:  Alcohol use.  Tobacco use.  Drug use.  Emotional well-being.  Home and relationship well-being.  Sexual activity.  Eating habits.  History of falls.  Memory and ability to understand (cognition).  Work and work Statistician.  Reproductive health. Screening  You may have the following tests or measurements:  Height, weight, and BMI.  Blood pressure.  Lipid and cholesterol levels. These may be checked every 5 years, or more frequently if you are over 33 years old.  Skin check.  Lung cancer screening. You may have this screening every year starting at age 75 if you have a 30-pack-year history of smoking and currently smoke or have quit within the past 15 years.  Fecal occult blood test (FOBT) of the stool. You may have this test every year starting at age 79.  Flexible sigmoidoscopy or colonoscopy. You may have a sigmoidoscopy every 5 years or a colonoscopy every 10 years starting at age 79.  Hepatitis C blood test.  Hepatitis B blood test.  Sexually transmitted disease (STD) testing.  Diabetes screening. This is done by checking your blood sugar (glucose) after you have not eaten for a while (fasting). You may have this done every 1-3 years.  Bone density scan. This is done to screen for osteoporosis. You may have this done starting at age 19.  Mammogram. This may be done every 1-2 years. Talk to your health care provider about how often you should have regular mammograms. Talk with your health care provider about  your test results, treatment options, and if necessary, the need for more tests. Vaccines  Your health care provider may recommend certain vaccines, such as:  Influenza vaccine. This is recommended every year.  Tetanus, diphtheria, and acellular pertussis (Tdap, Td) vaccine. You may need a Td booster every 10 years.  Zoster vaccine. You may need this after age 45.  Pneumococcal 13-valent conjugate (PCV13) vaccine. One dose is recommended after age 73.  Pneumococcal polysaccharide (PPSV23) vaccine. One dose is recommended after age 105. Talk to your health care provider about which screenings and vaccines you need and how often you need them. This information is not intended to replace advice given to you by your health care provider. Make sure you discuss any questions you have with your health care provider. Document Released: 04/13/2015 Document Revised: 12/05/2015 Document Reviewed: 01/16/2015 Elsevier Interactive Patient Education  2017 [employer] Prevention in the Home Falls can cause injuries. They can happen to people of all ages. There are many things you can do to make your home safe and to help prevent falls. What can I do on the outside of my home?  Regularly fix the edges of walkways and driveways and fix any cracks.  Remove anything that might make you trip as you walk through a door, such as a raised step or threshold.  Trim any bushes or trees on the path to your home.  Use bright outdoor lighting.  Clear any walking paths of anything that might make someone trip, such as rocks or tools.  Regularly check to see if handrails are loose or broken. Make sure that both sides of any steps have handrails.  Any raised decks and porches should have guardrails on the edges.  Have any leaves, snow, or ice cleared regularly.  Use sand or salt on walking paths during winter.  Clean up any spills in your garage right away. This includes oil or grease spills. What  can I do in the bathroom?  Use night lights.  Install grab bars by the toilet and in the tub and shower. Do not use towel bars as grab bars.  Use non-skid mats or decals in the tub or shower.  If you need to sit down in the shower, use a plastic, non-slip stool.  Keep the floor dry. Clean up any water that spills on the floor as soon as it happens.  Remove soap buildup in the tub or shower regularly.  Attach bath mats securely with double-sided non-slip rug tape.  Do not have throw rugs and other things on the floor that can make you trip. What can I do in the bedroom?  Use night lights.  Make sure that you have a light by your bed that is easy to reach.  Do not use any sheets or blankets that are too big for your bed. They should not hang down onto the floor.  Have a firm chair that has side arms. You can use this for support while you get dressed.  Do not have throw rugs and other things on the floor that can make you trip. What can I do in the kitchen?  Clean up any spills right away.  Avoid walking  on wet floors.  Keep items that you use a lot in easy-to-reach places.  If you need to reach something above you, use a strong step stool that has a grab bar.  Keep electrical cords out of the way.  Do not use floor polish or wax that makes floors slippery. If you must use wax, use non-skid floor wax.  Do not have throw rugs and other things on the floor that can make you trip. What can I do with my stairs?  Do not leave any items on the stairs.  Make sure that there are handrails on both sides of the stairs and use them. Fix handrails that are broken or loose. Make sure that handrails are as long as the stairways.  Check any carpeting to make sure that it is firmly attached to the stairs. Fix any carpet that is loose or worn.  Avoid having throw rugs at the top or bottom of the stairs. If you do have throw rugs, attach them to the floor with carpet tape.  Make sure  that you have a light switch at the top of the stairs and the bottom of the stairs. If you do not have them, ask someone to add them for you. What else can I do to help prevent falls?  Wear shoes that:  Do not have high heels.  Have rubber bottoms.  Are comfortable and fit you well.  Are closed at the toe. Do not wear sandals.  If you use a stepladder:  Make sure that it is fully opened. Do not climb a closed stepladder.  Make sure that both sides of the stepladder are locked into place.  Ask someone to hold it for you, if possible.  Clearly mark and make sure that you can see:  Any grab bars or handrails.  First and last steps.  Where the edge of each step is.  Use tools that help you move around (mobility aids) if they are needed. These include:  Canes.  Walkers.  Scooters.  Crutches.  Turn on the lights when you go into a dark area. Replace any light bulbs as soon as they burn out.  Set up your furniture so you have a clear path. Avoid moving your furniture around.  If any of your floors are uneven, fix them.  If there are any pets around you, be aware of where they are.  Review your medicines with your doctor. Some medicines can make you feel dizzy. This can increase your chance of falling. Ask your doctor what other things that you can do to help prevent falls. This information is not intended to replace advice given to you by your health care provider. Make sure you discuss any questions you have with your health care provider. Document Released: 01/11/2009 Document Revised: 08/23/2015 Document Reviewed: 04/21/2014 Elsevier Interactive Patient Education  2017 [employer].

## 2020-06-26 NOTE — Addendum Note (Signed)
Addended by: Ofilia Neas R on: 06/26/2020 10:20 AM   Modules accepted: Orders

## 2020-06-27 ENCOUNTER — Other Ambulatory Visit: Payer: Self-pay | Admitting: *Deleted

## 2020-06-27 ENCOUNTER — Ambulatory Visit: Payer: PPO

## 2020-06-27 DIAGNOSIS — Z87891 Personal history of nicotine dependence: Secondary | ICD-10-CM

## 2020-07-13 ENCOUNTER — Ambulatory Visit: Payer: PPO | Admitting: Family Medicine

## 2020-07-23 ENCOUNTER — Ambulatory Visit (INDEPENDENT_AMBULATORY_CARE_PROVIDER_SITE_OTHER): Payer: PPO | Admitting: Family Medicine

## 2020-07-23 ENCOUNTER — Encounter: Payer: Self-pay | Admitting: Family Medicine

## 2020-07-23 ENCOUNTER — Other Ambulatory Visit: Payer: Self-pay

## 2020-07-23 VITALS — BP 120/58 | HR 78 | Temp 98.0°F | Ht 61.0 in | Wt 177.6 lb

## 2020-07-23 DIAGNOSIS — E785 Hyperlipidemia, unspecified: Secondary | ICD-10-CM | POA: Diagnosis not present

## 2020-07-23 DIAGNOSIS — H8103 Meniere's disease, bilateral: Secondary | ICD-10-CM

## 2020-07-23 DIAGNOSIS — R5383 Other fatigue: Secondary | ICD-10-CM | POA: Diagnosis not present

## 2020-07-23 DIAGNOSIS — R0600 Dyspnea, unspecified: Secondary | ICD-10-CM | POA: Diagnosis not present

## 2020-07-23 DIAGNOSIS — E039 Hypothyroidism, unspecified: Secondary | ICD-10-CM

## 2020-07-23 DIAGNOSIS — E559 Vitamin D deficiency, unspecified: Secondary | ICD-10-CM | POA: Diagnosis not present

## 2020-07-23 DIAGNOSIS — E538 Deficiency of other specified B group vitamins: Secondary | ICD-10-CM

## 2020-07-23 DIAGNOSIS — I10 Essential (primary) hypertension: Secondary | ICD-10-CM

## 2020-07-23 LAB — CBC WITH DIFFERENTIAL/PLATELET
Basophils Absolute: 0.1 10*3/uL (ref 0.0–0.1)
Basophils Relative: 0.6 % (ref 0.0–3.0)
Eosinophils Absolute: 0.2 10*3/uL (ref 0.0–0.7)
Eosinophils Relative: 1.9 % (ref 0.0–5.0)
HCT: 41.7 % (ref 36.0–46.0)
Hemoglobin: 14.4 g/dL (ref 12.0–15.0)
Lymphocytes Relative: 25.5 % (ref 12.0–46.0)
Lymphs Abs: 2.3 10*3/uL (ref 0.7–4.0)
MCHC: 34.5 g/dL (ref 30.0–36.0)
MCV: 91.3 fl (ref 78.0–100.0)
Monocytes Absolute: 0.7 10*3/uL (ref 0.1–1.0)
Monocytes Relative: 8.4 % (ref 3.0–12.0)
Neutro Abs: 5.6 10*3/uL (ref 1.4–7.7)
Neutrophils Relative %: 63.6 % (ref 43.0–77.0)
Platelets: 221 10*3/uL (ref 150.0–400.0)
RBC: 4.57 Mil/uL (ref 3.87–5.11)
RDW: 13.1 % (ref 11.5–15.5)
WBC: 8.8 10*3/uL (ref 4.0–10.5)

## 2020-07-23 LAB — COMPREHENSIVE METABOLIC PANEL
ALT: 12 U/L (ref 0–35)
AST: 16 U/L (ref 0–37)
Albumin: 4.1 g/dL (ref 3.5–5.2)
Alkaline Phosphatase: 76 U/L (ref 39–117)
BUN: 16 mg/dL (ref 6–23)
CO2: 33 mEq/L — ABNORMAL HIGH (ref 19–32)
Calcium: 9.5 mg/dL (ref 8.4–10.5)
Chloride: 99 mEq/L (ref 96–112)
Creatinine, Ser: 0.78 mg/dL (ref 0.40–1.20)
GFR: 74.78 mL/min (ref >60.00)
Glucose, Bld: 102 mg/dL — ABNORMAL HIGH (ref 70–99)
Potassium: 4.7 mEq/L (ref 3.5–5.1)
Sodium: 138 mEq/L (ref 135–145)
Total Bilirubin: 0.5 mg/dL (ref 0.2–1.2)
Total Protein: 7.6 g/dL (ref 6.0–8.3)

## 2020-07-23 LAB — VITAMIN B12: Vitamin B-12: 357 pg/mL (ref 211–911)

## 2020-07-23 LAB — TSH: TSH: 0.81 u[IU]/mL (ref 0.35–4.50)

## 2020-07-23 LAB — LIPID PANEL
Cholesterol: 157 mg/dL (ref 0–200)
HDL: 45.3 mg/dL (ref >39.00)
LDL Cholesterol: 87 mg/dL (ref 0–99)
NonHDL: 112.11
Total CHOL/HDL Ratio: 3
Triglycerides: 126 mg/dL (ref 0.0–149.0)
VLDL: 25.2 mg/dL (ref 0.0–40.0)

## 2020-07-23 LAB — VITAMIN D 25 HYDROXY (VIT D DEFICIENCY, FRACTURES): VITD: 11.81 ng/mL — ABNORMAL LOW (ref 30.00–100.00)

## 2020-07-23 LAB — FERRITIN: Ferritin: 221.8 ng/mL (ref 10.0–291.0)

## 2020-07-23 NOTE — Progress Notes (Signed)
Marissa Shah DOB: July 03, 1945 Encounter date: 07/23/2020  This is a 75 y.o. female who presents with Chief Complaint  Patient presents with  . Follow-up    History of present illness:  HTN: Ziac 2.5-6.25 mg daily. Not checking at home, but good about taking medications. No headaches/light headedness.  HL: crestor 10mg ; does well with this; no cramps.  Hypothyroid: Synthroid 88 mcg daily  she didn't make appointments yet because she was caring for someone. She is planning on making appointment for both mammogram, density, and colonoscopy.   She has appointment for lung cancer screening next Monday.   She wonders if thyroid medication needs changed; hard to loose weight. She doesn't go out for walk because she gets out of breath. If she brings in all of groceries she will be out of breath. Has noticed this since gaining weight. 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   No chest pain/pressure. Not coughing a lot. Energy level not quite as good as it used to be. Does more sitting in recliner; feels like it is her fault/deconditioning. She has noted this for years; even sent for stress testing in remote past (unsure year) but had normal stress test (states that EKG had abnormality which is why she was sent)  Seasonal allergies: sometimes she will take medication; but she has to be careful due to meniere - if she dries up sinuses too much then menieres will flare.    Allergies  Allergen Reactions  . Elemental Sulfur     Burning in throat!  . Sulfamethoxazole Other (See Comments)    Patient feels like her mouth is on fire Patient feels like her mouth is on fire   Current Meds  Medication Sig  . bisoprolol-hydrochlorothiazide (ZIAC) 2.5-6.25 MG tablet Take 1 tablet by mouth daily.  . Calcium Carbonate-Vitamin D (CALTRATE 600+D PO) Take by mouth daily.  Marland Kitchen levothyroxine (SYNTHROID) 88 MCG tablet Take 1 tablet (88 mcg total) by mouth daily  before breakfast.  . lisinopril (ZESTRIL) 5 MG tablet Take 1 tablet (5 mg total) by mouth daily.  . rosuvastatin (CRESTOR) 10 MG tablet Take 1 tablet (10 mg total) by mouth daily.    Review of Systems  Constitutional: Negative for chills, fatigue and fever.  HENT: Positive for congestion (allergies).   Respiratory: Positive for shortness of breath (with prolonged activity). Negative for cough, chest tightness and wheezing.   Cardiovascular: Negative for chest pain, palpitations and leg swelling.    Objective:  BP (!) 120/58 (BP Location: Left Arm, Patient Position: Sitting, Cuff Size: Large)   Pulse 78   Temp 98 F (36.7 C) (Oral)   Ht 5\' 1"  (1.549 m)   Wt 177 lb 9.6 oz (80.6 kg)   BMI 33.56 kg/m   Weight: 177 lb 9.6 oz (80.6 kg)   BP Readings from Last 3 Encounters:  07/23/20 (!) 120/58  01/11/20 118/60  06/10/19 118/60   Wt Readings from Last 3 Encounters:  07/23/20 177 lb 9.6 oz (80.6 kg)  01/11/20 171 lb 14.4 oz (78 kg)  06/10/19 174 lb 14.4 oz (79.3 kg)    Physical Exam Constitutional:      General: She is not in acute distress.    Appearance: She is well-developed.  Cardiovascular:     Rate and Rhythm: Normal rate and regular rhythm.     Heart sounds: Normal heart sounds. No murmur heard. No friction rub.  Pulmonary:     Effort: Pulmonary effort is  normal. No respiratory distress.     Breath sounds: Examination of the right-lower field reveals decreased breath sounds. Examination of the left-lower field reveals decreased breath sounds. Decreased breath sounds present. No wheezing or rales.  Musculoskeletal:     Right lower leg: No edema.     Left lower leg: No edema.  Neurological:     Mental Status: She is alert and oriented to person, place, and time.  Psychiatric:        Behavior: Behavior normal.     Assessment/Plan  1. Primary hypertension Well controlled; continue ziac2.5-6.25 - CBC with Differential/Platelet; Future - Comprehensive metabolic  panel; Future - Comprehensive metabolic panel - CBC with Differential/Platelet  2. Hypothyroidism, unspecified type Synthroid 61mcg - TSH; Future - TSH  3. Meniere's disease of both ears Controlled; limits decongestants which worsen sx for her.  4. Hyperlipidemia, unspecified hyperlipidemia type crestor 10mg  daily - Lipid panel; Future - Lipid panel  5. Dyspnea, unspecified type ekg sinus rhythm, no acute changes. Will review lung screening imaging once complete; could consider PFT/further cardiac eval in future. Patient plans to work on more regular activity and weight loss which will help.  - EKG 12-Lead  6. Fatigue, unspecified type - Ferritin; Future - Ferritin  7. B12 deficiency - Vitamin B12; Future - Vitamin B12  8. Vitamin D deficiency - VITAMIN D 25 Hydroxy (Vit-D Deficiency, Fractures); Future - VITAMIN D 25 Hydroxy (Vit-D Deficiency, Fractures)   she will schedule mammogram, density, colonoscopy. Return in about 6 months (around 01/22/2021) for physical exam.     Micheline Rough, MD

## 2020-07-30 ENCOUNTER — Other Ambulatory Visit: Payer: Self-pay

## 2020-07-30 ENCOUNTER — Encounter: Payer: Self-pay | Admitting: Acute Care

## 2020-07-30 ENCOUNTER — Ambulatory Visit (INDEPENDENT_AMBULATORY_CARE_PROVIDER_SITE_OTHER)
Admission: RE | Admit: 2020-07-30 | Discharge: 2020-07-30 | Disposition: A | Discharge status: Home / Self Care | Payer: PPO | Source: Ambulatory Visit | Attending: Acute Care | Admitting: Acute Care

## 2020-07-30 ENCOUNTER — Ambulatory Visit (INDEPENDENT_AMBULATORY_CARE_PROVIDER_SITE_OTHER): Payer: PPO | Admitting: Acute Care

## 2020-07-30 VITALS — BP 122/74 | HR 88 | Temp 98.2°F | Ht 61.0 in | Wt 177.0 lb

## 2020-07-30 DIAGNOSIS — Z122 Encounter for screening for malignant neoplasm of respiratory organs: Secondary | ICD-10-CM

## 2020-07-30 DIAGNOSIS — Z87891 Personal history of nicotine dependence: Secondary | ICD-10-CM

## 2020-07-30 NOTE — Progress Notes (Signed)
Shared Decision Making Visit Lung Cancer Screening Program [phone])   Eligibility:  Age 75 y.o.  Pack Years Smoking History Calculation 69 pack year smoking history (# packs/per year x # years smoked)  Recent History of coughing up blood  no  Unexplained weight loss? no ( >Than 15 pounds within the last 6 months )  Prior History Lung / other cancer no (Diagnosis within the last 5 years already requiring surveillance chest CT Scans).  Smoking Status Former Smoker  Former Smokers: Years since quit: 12 years  Quit Date: 2010  Visit Components:  Discussion included one or more decision making aids. yes  Discussion included risk/benefits of screening. yes  Discussion included potential follow up diagnostic testing for abnormal scans. yes  Discussion included meaning and risk of over diagnosis. yes  Discussion included meaning and risk of False Positives. yes  Discussion included meaning of total radiation exposure. yes  Counseling Included:  Importance of adherence to annual lung cancer LDCT screening. yes  Impact of comorbidities on ability to participate in the program. yes  Ability and willingness to under diagnostic treatment. yes  Smoking Cessation Counseling:  Current Smokers:   Discussed importance of smoking cessation. yes  Information about tobacco cessation classes and interventions provided to patient. yes  Patient provided with "ticket" for LDCT Scan. yes  Symptomatic Patient. no  Counseling NA  Diagnosis Code: Tobacco Use Z72.0  Asymptomatic Patient yes  Counseling (Intermediate counseling: > three minutes counseling) M8413  Former Smokers:   Discussed the importance of maintaining cigarette abstinence. 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 Yes  Patient provided with "ticket" for LDCT Scan. yes  Written Order for Lung  Cancer Screening with LDCT placed in Epic. Yes (CT Chest Lung Cancer Screening Low Dose W/O CM) [id number] Z12.2-Screening of respiratory organs Z87.891-Personal history of nicotine dependence  I spent 25 minutes of face to face time with Marissa Shah discussing the risks and benefits of lung cancer screening. We viewed a power point together that explained in detail the above noted topics. We took the time to pause the power point at intervals to allow for questions to be asked and answered to ensure understanding. We discussed that she had taken the single most powerful action possible to decrease her risk of developing lung cancer when she quit smoking. I counseled her to remain smoke free, and to contact me if she ever had the desire to smoke again so that I can provide resources and tools to help support the effort to remain smoke free. We discussed the time and location of the scan, and that either  Marissa Glassman RN or I will call with the results within  24-48 hours of receiving them. She has my card and contact information in the event she needs to speak with me, in addition to a copy of the power point we reviewed as a resource. She verbalized understanding of all of the above and had no further questions upon leaving the office.     I explained to the patient that there has been a high incidence of coronary artery disease noted on these exams. I explained that this is a non-gated exam therefore degree or severity cannot be determined. This patient is currently on statin therapy. I have asked the patient to follow-up with their PCP regarding any incidental finding of coronary artery disease and management with diet or medication as they feel is  clinically indicated. The patient verbalized understanding of the above and had no further questions.     Magdalen Spatz, NP 07/30/2020 2:32 PM \

## 2020-07-30 NOTE — Patient Instructions (Signed)
Thank you for participating in the [facility] Scre[facility]. It was our pleasure to meet you today. We will call you with the results of your scan within the next few days. Your scan will be assigned a Lung RADS category score by the physicians reading the scans.  This Lung RADS score determines follow up scanning.  See below for description of categories, and follow up screening recommendations. We will be in touch to schedule your follow up screening annually or based on recommendations of our providers. We will fax a copy of your scan results to your Primary Care Physician, or the physician who referred you to the program, to ensure they have the results. Please call the office if you have any questions or concerns regarding your scanning experience or results.  Our office number is [phone]. Please speak with Denise Phelps, RN. She is our Lung Cancer Screening RN. If she is unavailable when you call, please have the office staff send her a message. She will return your call at her earliest convenience. Remember, if your scan is normal, we will scan you annually as long as you continue to meet the criteria for the program. (Age 55-77, Current smoker or smoker who has quit within the last 15 years). If you are a smoker, remember, quitting is the single most powerful action that you can take to decrease your risk of lung cancer and other pulmonary, breathing related problems. We know quitting is hard, and we are here to help.  Please let us know if there is anything we can do to help you meet your goal of quitting. If you are a former smoker, congratulations. We are proud of you! Remain smoke free! Remember you can refer friends or family members through the number above.  We will screen them to make sure they meet criteria for the program. Thank you for helping us take better care of you by participating in Lung Screening.  Lung RADS Categories:  Lung RADS 1: no nodules  or definitely non-concerning nodules.  Recommendation is for a repeat annual scan in 12 months.  Lung RADS 2:  nodules that are non-concerning in appearance and behavior with a very low likelihood of becoming an active cancer. Recommendation is for a repeat annual scan in 12 months.  Lung RADS 3: nodules that are probably non-concerning , includes nodules with a low likelihood of becoming an active cancer.  Recommendation is for a 6-month repeat screening scan. Often noted after an upper respiratory illness. We will be in touch to make sure you have no questions, and to schedule your 6-month scan.  Lung RADS 4 A: nodules with concerning findings, recommendation is most often for a follow up scan in 3 months or additional testing based on our provider's assessment of the scan. We will be in touch to make sure you have no questions and to schedule the recommended 3 month follow up scan.  Lung RADS 4 B:  indicates findings that are concerning. We will be in touch with you to schedule additional diagnostic testing based on our provider's  assessment of the scan.   

## 2020-08-02 ENCOUNTER — Other Ambulatory Visit: Payer: Self-pay | Admitting: Family Medicine

## 2020-08-02 DIAGNOSIS — E2839 Other primary ovarian failure: Secondary | ICD-10-CM

## 2020-08-07 MED ORDER — VITAMIN D (ERGOCALCIFEROL) 1.25 MG (50000 UNIT) PO CAPS: 50000.0000 [IU] | ORAL_CAPSULE | ORAL | 0 refills | Status: DC

## 2020-08-07 NOTE — Addendum Note (Signed)
Addended by: Agnes Lawrence on: 08/07/2020 07:58 AM   Modules accepted: Orders

## 2020-08-08 NOTE — Progress Notes (Signed)
Please call patient and let them  know their  low dose Ct was read as a Lung  RADS 3, nodules that are probably benign findings, short term follow up suggested: includes nodules with a low likelihood of becoming a clinically active cancer. Radiology recommends a 6 month repeat LDCT follow up. .Please let them  know we will order and schedule their  annual screening scan for 01/2021. Please let them  know there was notation of CAD on their  scan.  Please remind the patient  that this is a non-gated exam therefore degree or severity of disease  cannot be determined. Please have them  follow up with their PCP regarding potential risk factor modification, dietary therapy or pharmacologic therapy if clinically indicated. Pt.  is  currently on statin therapy. Please place order for annual  screening scan for  01/2021 and fax results to PCP. Thanks so much.

## 2020-08-09 ENCOUNTER — Other Ambulatory Visit: Payer: Self-pay | Admitting: *Deleted

## 2020-08-09 DIAGNOSIS — Z87891 Personal history of nicotine dependence: Secondary | ICD-10-CM

## 2020-10-02 ENCOUNTER — Other Ambulatory Visit: Payer: Self-pay | Admitting: Family Medicine

## 2020-10-02 DIAGNOSIS — E785 Hyperlipidemia, unspecified: Secondary | ICD-10-CM

## 2020-10-02 DIAGNOSIS — I10 Essential (primary) hypertension: Secondary | ICD-10-CM

## 2020-10-02 NOTE — Telephone Encounter (Signed)
Last refills sent in from Dr. Elease Hashimoto- 06/26/20----90 tabs for all no refills  Dr. Ethlyn Gallery patient. Can this patient receive more refills from you or should this message be forward to Dr. Ethlyn Gallery.  Please advise

## 2020-10-04 NOTE — Telephone Encounter (Addendum)
Last office visit- 07/23/20  See messages.  Thanks

## 2020-10-25 ENCOUNTER — Other Ambulatory Visit: Payer: Self-pay | Admitting: Family Medicine

## 2020-11-27 ENCOUNTER — Telehealth: Payer: Self-pay | Admitting: *Deleted

## 2020-11-27 NOTE — Telephone Encounter (Signed)
Spoke with the patient and gave her the number to contact [facility] at [phone] to schedule a colonoscopy as below.

## 2020-11-27 NOTE — Telephone Encounter (Signed)
-----   Message from Caren Macadam, MD sent at 11/26/2020 10:19 PM EDT ----- Regarding: FW: Other Please see if you can get a hold of patient to give number for GI so that she can set up colonoscopy. She has not returned their calls.  ----- Message ----- From: Thelma Barge, Ronald Lobo Sent: 11/21/2020  11:07 AM EDT To: Caren Macadam, MD Subject: Other

## 2021-01-02 ENCOUNTER — Other Ambulatory Visit: Payer: Self-pay | Admitting: Family Medicine

## 2021-01-02 DIAGNOSIS — E785 Hyperlipidemia, unspecified: Secondary | ICD-10-CM

## 2021-01-02 DIAGNOSIS — I10 Essential (primary) hypertension: Secondary | ICD-10-CM

## 2021-01-04 ENCOUNTER — Telehealth: Payer: Self-pay | Admitting: Family Medicine

## 2021-01-04 NOTE — Telephone Encounter (Signed)
[facility] called to ask if it was ok to switch the EUTHYROX 88 MCG tablet to a different brand as they are unable to fill it under that brand name.      Good callback number is [phone]     Please Advise

## 2021-01-04 NOTE — Telephone Encounter (Signed)
Ok; just make patient aware.

## 2021-01-07 NOTE — Telephone Encounter (Signed)
Spoke with the pharmacist Drai and informed her of the message below.

## 2021-01-16 ENCOUNTER — Encounter: Payer: Self-pay | Admitting: Family Medicine

## 2021-01-16 ENCOUNTER — Telehealth (INDEPENDENT_AMBULATORY_CARE_PROVIDER_SITE_OTHER): Payer: PPO | Admitting: Family Medicine

## 2021-01-16 DIAGNOSIS — U071 COVID-19: Secondary | ICD-10-CM

## 2021-01-16 MED ORDER — MOLNUPIRAVIR EUA 200MG CAPSULE: 4.0000 | ORAL_CAPSULE | Freq: Two times a day (BID) | ORAL | 0 refills | Status: AC

## 2021-01-16 NOTE — Progress Notes (Signed)
Patient ID: Marissa Shah, female   DOB: 1946/01/17, 75 y.o.   MRN: [id number]  This visit type was conducted due to national recommendations for restrictions regarding the COVID-19 pandemic in an effort to limit this patient's exposure and mitigate transmission in our community.   Virtual Visit via Telephone Note  I connected with Marissa Shah on 01/16/21 at  5:00 PM EDT by telephone and verified that I am speaking with the correct person using two identifiers.   I discussed the limitations, risks, security and privacy concerns of performing an evaluation and management service by telephone and the availability of in person appointments. I also discussed with the patient that there may be a patient responsible charge related to this service. The patient expressed understanding and agreed to proceed.  Location patient: home Location provider: work or home office Participants present for the call: patient, provider Patient did not have a visit in the prior 7 days to address this/these issue(s).   History of Present Illness:  Onset last night of some cough, earache, nasal congestion.  Her husband just recently tested positive for COVID.  She tested positive this morning.  Around noon she had temperature 99.9 and by early afternoon 100.9 and about an hour ago 101.3.  Denies any nausea, vomiting, or diarrhea.  No dysuria.  No significant dyspnea.  No abdominal pain.  Her chronic problems include history of hypertension, hypothyroidism, Mnire's disease  Past Medical History:  Diagnosis Date   Hypertension    Thyroid disease    Past Surgical History:  Procedure Laterality Date   ABDOMINAL HYSTERECTOMY     broken arm     left wrist/has screws and plates   Goiter  8416   removed    reports that she quit smoking about 14 years ago. She has a 62.50 pack-year smoking history. She has never used smokeless tobacco. She reports that she does not drink alcohol and does not use  drugs. family history includes Alcohol abuse in her brother and brother; Hearing loss in her brother and father; Hypertension in her brother; Other in her father and mother. Allergies  Allergen Reactions   Elemental Sulfur     Burning in throat!   Sulfamethoxazole Other (See Comments)    Patient feels like her mouth is on fire Patient feels like her mouth is on fire      Observations/Objective: Patient sounds cheerful and well on the phone. I do not appreciate any SOB. Speech and thought processing are grossly intact. Patient reported vitals:  Assessment and Plan:  COVID-19 infection  -Plenty of fluids and rest -Start Molnupiravir 4 capsules by mouth twice daily for 5 days -Go to ER promptly for any increased shortness of breath or other concerns  Follow Up Instructions:    99441 5-10 99442 11-20 99443 21-30 I did not refer this patient for an OV in the next 24 hours for this/these issue(s).  I discussed the assessment and treatment plan with the patient. The patient was provided an opportunity to ask questions and all were answered. The patient agreed with the plan and demonstrated an understanding of the instructions.   The patient was advised to call back or seek an in-person evaluation if the symptoms worsen or if the condition fails to improve as anticipated.  I provided 18 minutes of non-face-to-face time during this encounter.   Carolann Littler, MD

## 2021-01-18 ENCOUNTER — Telehealth (INDEPENDENT_AMBULATORY_CARE_PROVIDER_SITE_OTHER): Payer: PPO | Admitting: Family Medicine

## 2021-01-18 ENCOUNTER — Other Ambulatory Visit: Payer: Self-pay

## 2021-01-18 ENCOUNTER — Encounter: Payer: Self-pay | Admitting: Family Medicine

## 2021-01-18 ENCOUNTER — Telehealth: Payer: Self-pay | Admitting: Family Medicine

## 2021-01-18 VITALS — Temp 99.5°F

## 2021-01-18 DIAGNOSIS — U071 COVID-19: Secondary | ICD-10-CM | POA: Diagnosis not present

## 2021-01-18 NOTE — Telephone Encounter (Signed)
Pt husband call and stated he want a call back to tell him what he can get for pt congestion and sore throat .

## 2021-01-18 NOTE — Progress Notes (Signed)
Virtual Visit via Telephone Note  I connected with Marissa Shah on 01/18/21 at  4:00 PM EDT by telephone and verified that I am speaking with the correct person using two identifiers.   I discussed the limitations, risks, security and privacy concerns of performing an evaluation and management service by telephone and the availability of in person appointments. I also discussed with the patient that there may be a patient responsible charge related to this service. The patient expressed understanding and agreed to proceed.  Location patient: home Location provider: work or home office Participants present for the call: patient, provider, Pt's husband Marden Noble Patient did not have a visit in the prior 7 days to address this/these issue(s).  Chief Complaint  Patient presents with   Sore Throat    X3 days, worse this morning   Results    Patient's husband states the patient tested positive for Covid 2 days ago via home test, taking antiviral that was prescribed by Dr Elease Hashimoto 4 days ago    Sinus Problem    Patient complains of sinus congestion x3 days     History of Present Illness: Pt with nasal congestion, productive cough, sore throat, fever T-max 101F.  Took Tylenol which helped break fever.  Sick contacts include pt's husband who tested positive on Monday for COVID-19.  Pt tested positive on Wed 01/16/21 with symptoms starting with a sore throat on Tuesday.  Pt seen virtually 01/16/21 by Dr. Elease Hashimoto, given Molnupiravir.  Pt started taking the medication yesterday.   Observations/Objective: Patient sounds congested and well on the phone. I do not appreciate any SOB. Speech and thought processing are grossly intact. Patient reported vitals:  99.11F  Assessment and Plan: COVID-19 virus infection -Positive COVID test on Wednesday, 01/16/2021 -continue molnupiravir -Continue supportive care including hydration, gargling with warm salt water or Chloraseptic spray, OTC cough/cold  medications such as plain Robitussin or Coricidin HBP -Given strict ED precautions for worsening symptoms  Follow Up Instructions:  prn   99441 5-10 99442 11-20 9443 21-30 I did not refer this patient for an OV in the next 24 hours for this/these issue(s).  I discussed the assessment and treatment plan with the patient. The patient was provided an opportunity to ask questions and all were answered. The patient agreed with the plan and demonstrated an understanding of the instructions.   The patient was advised to call back or seek an in-person evaluation if the symptoms worsen or if the condition fails to improve as anticipated.  I provided 8 minutes of non-face-to-face time during this encounter.   Billie Ruddy, MD

## 2021-01-18 NOTE — Telephone Encounter (Signed)
Spoke with the patent's husband and scheduled a virtual visit with Dr Volanda Napoleon today at [place].

## 2021-01-24 NOTE — Telephone Encounter (Signed)
error 

## 2021-01-29 ENCOUNTER — Telehealth (INDEPENDENT_AMBULATORY_CARE_PROVIDER_SITE_OTHER): Payer: PPO | Admitting: Family Medicine

## 2021-01-29 DIAGNOSIS — R0600 Dyspnea, unspecified: Secondary | ICD-10-CM

## 2021-01-29 DIAGNOSIS — U071 COVID-19: Secondary | ICD-10-CM

## 2021-01-29 NOTE — Progress Notes (Signed)
Patient ID: Marissa Shah, female   DOB: 05/30/1945, 75 y.o.   MRN: [id number]  This visit type was conducted due to national recommendations for restrictions regarding the COVID-19 pandemic in an effort to limit this patient's exposure and mitigate transmission in our community.   Virtual Visit via Video Note  I connected with Marissa Shah on 01/29/21 at  3:45 PM EDT by a video enabled telemedicine application and verified that I am speaking with the correct person using two identifiers.  Location patient: home Location provider:work or home office Persons participating in the virtual visit: patient, provider  I discussed the limitations of evaluation and management by telemedicine and the availability of in person appointments. The patient expressed understanding and agreed to proceed.   HPI:  Marissa Shah had COVID 19 infection couple weeks ago.  She was diagnosed on 19 October.  She had virtual visit on the 21st.  She was prescribed Molnupiravir  She had some improvement but still has some fatigue and chest congestion and occasional cough at this time.  No recurrent fever.  Some mild dyspnea.  No chest pain.  No pleuritic pain.  They were concerned that she was not making better progress at this time.  Her husband assisted on this call as patient has difficulty hearing.  His main concern is whether she could have a secondary pneumonia.  They do not have a home pulse oximeter.  She is not aware of any chronic lung disease but apparently does have a longstanding history of smoking and quit around 2008.  Denies any recent hemoptysis.   ROS: See pertinent positives and negatives per HPI.  Past Medical History:  Diagnosis Date   Hypertension    Thyroid disease     Past Surgical History:  Procedure Laterality Date   ABDOMINAL HYSTERECTOMY     broken arm     left wrist/has screws and plates   Goiter  9326   removed    Family History  Problem Relation Age of Onset   Other Mother         died from flu   Other Father        TB, multiple health issues   Hearing loss Father    Hypertension Brother    Hearing loss Brother    Alcohol abuse Brother    Alcohol abuse Brother    Colon cancer Neg Hx     SOCIAL HX: Ex-smoker.  Reported 62+ pack year history   Current Outpatient Medications:    bisoprolol-hydrochlorothiazide (ZIAC) 2.5-6.25 MG tablet, Take 1 tablet by mouth once daily, Disp: 90 tablet, Rfl: 0   Calcium Carbonate-Vitamin D (CALTRATE 600+D PO), Take by mouth daily., Disp: , Rfl:    levothyroxine (SYNTHROID) 88 MCG tablet, TAKE 1 TABLET BY MOUTH ONCE DAILY BEFORE BREAKFAST, Disp: 90 tablet, Rfl: 0   lisinopril (ZESTRIL) 5 MG tablet, Take 1 tablet by mouth once daily, Disp: 90 tablet, Rfl: 0   rosuvastatin (CRESTOR) 10 MG tablet, Take 1 tablet by mouth once daily, Disp: 90 tablet, Rfl: 0   Vitamin D, Ergocalciferol, (DRISDOL) 1.25 MG (50000 UNIT) CAPS capsule, Take 1 capsule (50,000 Units total) by mouth every 7 (seven) days., Disp: 12 capsule, Rfl: 0  EXAM:  VITALS per patient if applicable:  GENERAL: alert, oriented, appears well and in no acute distress  HEENT: atraumatic, conjunttiva clear, no obvious abnormalities on inspection of external nose and ears  NECK: normal movements of the head and neck  LUNGS: on inspection no  signs of respiratory distress, breathing rate appears normal, no obvious gross SOB, gasping or wheezing  CV: no obvious cyanosis  MS: moves all visible extremities without noticeable abnormality  PSYCH/NEURO: pleasant and cooperative, no obvious depression or anxiety, speech and thought processing grossly intact  ASSESSMENT AND PLAN:  Discussed the following assessment and plan:  Recent COVID-19 infection couple weeks ago.  Patient was treated with antivirals.  Nonspecific symptoms of some ongoing congestion and fatigue and mild dyspnea at this time.  -Recommend consider an office evaluation to further assess. -Consider PA  and lateral chest x-ray to further assess     I discussed the assessment and treatment plan with the patient. The patient was provided an opportunity to ask questions and all were answered. The patient agreed with the plan and demonstrated an understanding of the instructions.   The patient was advised to call back or seek an in-person evaluation if the symptoms worsen or if the condition fails to improve as anticipated.     Carolann Littler, MD

## 2021-01-30 ENCOUNTER — Ambulatory Visit (INDEPENDENT_AMBULATORY_CARE_PROVIDER_SITE_OTHER): Payer: PPO | Admitting: Adult Health

## 2021-01-30 ENCOUNTER — Encounter: Payer: Self-pay | Admitting: Adult Health

## 2021-01-30 ENCOUNTER — Other Ambulatory Visit: Payer: Self-pay

## 2021-01-30 ENCOUNTER — Ambulatory Visit (INDEPENDENT_AMBULATORY_CARE_PROVIDER_SITE_OTHER): Payer: PPO

## 2021-01-30 VITALS — BP 124/70 | HR 66 | Temp 98.6°F | Ht 61.0 in | Wt 177.0 lb

## 2021-01-30 DIAGNOSIS — U071 COVID-19: Secondary | ICD-10-CM

## 2021-01-30 DIAGNOSIS — R918 Other nonspecific abnormal finding of lung field: Secondary | ICD-10-CM | POA: Diagnosis not present

## 2021-01-30 DIAGNOSIS — R0989 Other specified symptoms and signs involving the circulatory and respiratory systems: Secondary | ICD-10-CM | POA: Diagnosis not present

## 2021-01-30 NOTE — Progress Notes (Signed)
Subjective:    Patient ID: Marissa Shah, female    DOB: 10-13-1945, 75 y.o.   MRN: [id number]  HPI 75 year old female who  has a past medical history of Hypertension and Thyroid disease.  She is a patient of Dr. Ethlyn Gallery. She had a virtual visit with Dr. Elease Hashimoto yesterday.   Had covid 19 infection a couple of weeks ago, diagnosed on October 19th. Had a virtual visit with another provider on 10/21 and prescribed Molnupiravir. Having some improvement but still had a lingering fatigue, chest congestion and occasional cough. No recurrent fevers, chest pain, or pleuritic pain. Has mild dyspnea.  Has long history of smoking and quit in 2008. Does not know of any chronic lung disease   Yesterday it was recommended follow up in office for further evaluation and have chest ray done.   Review of Systems See HPI   Past Medical History:  Diagnosis Date  . Hypertension   . Thyroid disease     Social History   Socioeconomic History  . Marital status: Married    Spouse name: Not on file  . Number of children: Not on file  . Years of education: Not on file  . Highest education level: Not on file  Occupational History  . Not on file  Tobacco Use  . Smoking status: Former    Packs/day: 2.50    Years: 25.00    Pack years: 62.50    Types: Cigarettes    Quit date: 08/13/2006    Years since quitting: 14.4  . Smokeless tobacco: Never  Substance and Sexual Activity  . Alcohol use: No    Alcohol/week: 0.0 standard drinks  . Drug use: No  . Sexual activity: Not on file  Other Topics Concern  . Not on file  Social History Narrative  . Not on file   Social Determinants of Health   Financial Resource Strain: Low Risk   . Difficulty of Paying Living Expenses: Not hard at all  Food Insecurity: No Food Insecurity  . Worried About Charity fundraiser in the Last Year: Never true  . Ran Out of Food in the Last Year: Never true  Transportation Needs: No Transportation Needs  .  Lack of Transportation (Medical): No  . Lack of Transportation (Non-Medical): No  Physical Activity: Inactive  . Days of Exercise per Week: 0 days  . Minutes of Exercise per Session: 0 min  Stress: No Stress Concern Present  . Feeling of Stress : Not at all  Social Connections: Moderately Isolated  . Frequency of Communication with Friends and Family: More than three times a week  . Frequency of Social Gatherings with Friends and Family: Once a week  . Attends Religious Services: Never  . Active Member of Clubs or Organizations: No  . Attends Archivist Meetings: Never  . Marital Status: Married  Human resources officer Violence: Not At Risk  . Fear of Current or Ex-Partner: No  . Emotionally Abused: No  . Physically Abused: No  . Sexually Abused: No    Past Surgical History:  Procedure Laterality Date  . ABDOMINAL HYSTERECTOMY    . broken arm     left wrist/has screws and plates  . Goiter  1968   removed    Family History  Problem Relation Age of Onset  . Other Mother        died from flu  . Other Father        TB, multiple health issues  .  Hearing loss Father   . Hypertension Brother   . Hearing loss Brother   . Alcohol abuse Brother   . Alcohol abuse Brother   . Colon cancer Neg Hx     Allergies  Allergen Reactions  . Elemental Sulfur     Burning in throat!  . Sulfamethoxazole Other (See Comments)    Patient feels like her mouth is on fire Patient feels like her mouth is on fire    Current Outpatient Medications on File Prior to Visit  Medication Sig Dispense Refill  . bisoprolol-hydrochlorothiazide (ZIAC) 2.5-6.25 MG tablet Take 1 tablet by mouth once daily 90 tablet 0  . Calcium Carbonate-Vitamin D (CALTRATE 600+D PO) Take by mouth daily.    Marland Kitchen levothyroxine (SYNTHROID) 88 MCG tablet TAKE 1 TABLET BY MOUTH ONCE DAILY BEFORE BREAKFAST 90 tablet 0  . lisinopril (ZESTRIL) 5 MG tablet Take 1 tablet by mouth once daily 90 tablet 0  . rosuvastatin (CRESTOR)  10 MG tablet Take 1 tablet by mouth once daily 90 tablet 0  . Vitamin D, Ergocalciferol, (DRISDOL) 1.25 MG (50000 UNIT) CAPS capsule Take 1 capsule (50,000 Units total) by mouth every 7 (seven) days. 12 capsule 0   No current facility-administered medications on file prior to visit.    BP 124/70   Pulse 66   Temp 98.6 F (37 C) (Oral)   Ht 5\' 1"  (1.549 m)   Wt 177 lb (80.3 kg)   SpO2 94%   BMI 33.44 kg/m       Objective:   Physical Exam Vitals and nursing note reviewed.  Constitutional:      Appearance: Normal appearance.  Cardiovascular:     Rate and Rhythm: Normal rate and regular rhythm.     Pulses: Normal pulses.     Heart sounds: Normal heart sounds.  Pulmonary:     Effort: Pulmonary effort is normal.     Breath sounds: Normal breath sounds.  Musculoskeletal:        General: Normal range of motion.  Skin:    General: Skin is warm and dry.     Capillary Refill: Capillary refill takes less than 2 seconds.  Neurological:     General: No focal deficit present.     Mental Status: She is alert and oriented to person, place, and time.  Psychiatric:        Mood and Affect: Mood normal.        Behavior: Behavior normal.        Thought Content: Thought content normal.        Judgment: Judgment normal.      Assessment & Plan:   1. COVID-19 virus infection - lungs sounds clear throughout. Will double check with xray  - Reassurance given  - DG Chest 2 View; Future   Dorothyann Peng, NP

## 2021-01-31 ENCOUNTER — Other Ambulatory Visit: Payer: Self-pay | Admitting: Adult Health

## 2021-01-31 MED ORDER — PREDNISONE 10 MG PO TABS: ORAL_TABLET | ORAL | 0 refills | Status: DC

## 2021-04-01 ENCOUNTER — Other Ambulatory Visit: Payer: Self-pay | Admitting: Family Medicine

## 2021-04-01 DIAGNOSIS — I10 Essential (primary) hypertension: Secondary | ICD-10-CM

## 2021-04-01 DIAGNOSIS — E785 Hyperlipidemia, unspecified: Secondary | ICD-10-CM

## 2021-05-20 ENCOUNTER — Telehealth: Payer: Self-pay | Admitting: Family Medicine

## 2021-05-20 NOTE — Telephone Encounter (Signed)
Tried calling patient to schedule Medicare Annual Wellness Visit (AWV) either virtually or in office.   Last AWV ;06/26/20  please schedule at anytime with [facility] Nurse Health Advisor 1 or 2   This should be a 45 minute visit.   Awv can be schedule calendar year healthteam

## 2021-06-28 ENCOUNTER — Encounter: Payer: Self-pay | Admitting: Family Medicine

## 2021-06-28 ENCOUNTER — Ambulatory Visit (INDEPENDENT_AMBULATORY_CARE_PROVIDER_SITE_OTHER): Payer: PPO | Admitting: Family Medicine

## 2021-06-28 VITALS — BP 118/60 | HR 70 | Temp 98.0°F | Ht 61.0 in | Wt 179.9 lb

## 2021-06-28 DIAGNOSIS — E2839 Other primary ovarian failure: Secondary | ICD-10-CM | POA: Diagnosis not present

## 2021-06-28 DIAGNOSIS — E039 Hypothyroidism, unspecified: Secondary | ICD-10-CM | POA: Diagnosis not present

## 2021-06-28 DIAGNOSIS — I10 Essential (primary) hypertension: Secondary | ICD-10-CM | POA: Diagnosis not present

## 2021-06-28 DIAGNOSIS — Z1231 Encounter for screening mammogram for malignant neoplasm of breast: Secondary | ICD-10-CM

## 2021-06-28 DIAGNOSIS — E785 Hyperlipidemia, unspecified: Secondary | ICD-10-CM | POA: Diagnosis not present

## 2021-06-28 LAB — CBC WITH DIFFERENTIAL/PLATELET
Basophils Absolute: 0.1 10*3/uL (ref 0.0–0.1)
Basophils Relative: 0.6 % (ref 0.0–3.0)
Eosinophils Absolute: 0.2 10*3/uL (ref 0.0–0.7)
Eosinophils Relative: 2.7 % (ref 0.0–5.0)
HCT: 41.3 % (ref 36.0–46.0)
Hemoglobin: 14 g/dL (ref 12.0–15.0)
Lymphocytes Relative: 24.8 % (ref 12.0–46.0)
Lymphs Abs: 2.2 10*3/uL (ref 0.7–4.0)
MCHC: 34 g/dL (ref 30.0–36.0)
MCV: 92.4 fl (ref 78.0–100.0)
Monocytes Absolute: 0.8 10*3/uL (ref 0.1–1.0)
Monocytes Relative: 8.6 % (ref 3.0–12.0)
Neutro Abs: 5.6 10*3/uL (ref 1.4–7.7)
Neutrophils Relative %: 63.3 % (ref 43.0–77.0)
Platelets: 225 10*3/uL (ref 150.0–400.0)
RBC: 4.46 Mil/uL (ref 3.87–5.11)
RDW: 12.7 % (ref 11.5–15.5)
WBC: 8.8 10*3/uL (ref 4.0–10.5)

## 2021-06-28 LAB — COMPREHENSIVE METABOLIC PANEL
ALT: 11 U/L (ref 0–35)
AST: 15 U/L (ref 0–37)
Albumin: 4.5 g/dL (ref 3.5–5.2)
Alkaline Phosphatase: 64 U/L (ref 39–117)
BUN: 18 mg/dL (ref 6–23)
CO2: 32 mEq/L (ref 19–32)
Calcium: 9.6 mg/dL (ref 8.4–10.5)
Chloride: 97 mEq/L (ref 96–112)
Creatinine, Ser: 0.73 mg/dL (ref 0.40–1.20)
GFR: 80.44 mL/min (ref >60.00)
Glucose, Bld: 99 mg/dL (ref 70–99)
Potassium: 4.1 mEq/L (ref 3.5–5.1)
Sodium: 137 mEq/L (ref 135–145)
Total Bilirubin: 0.5 mg/dL (ref 0.2–1.2)
Total Protein: 7.8 g/dL (ref 6.0–8.3)

## 2021-06-28 LAB — LIPID PANEL
Cholesterol: 181 mg/dL (ref 0–200)
HDL: 42 mg/dL (ref >39.00)
LDL Cholesterol: 111 mg/dL — ABNORMAL HIGH (ref 0–99)
NonHDL: 139.48
Total CHOL/HDL Ratio: 4
Triglycerides: 142 mg/dL (ref 0.0–149.0)
VLDL: 28.4 mg/dL (ref 0.0–40.0)

## 2021-06-28 LAB — TSH: TSH: 1.66 u[IU]/mL (ref 0.35–5.50)

## 2021-06-28 MED ORDER — LISINOPRIL 5 MG PO TABS: ORAL_TABLET | ORAL | 3 refills | Status: DC

## 2021-06-28 MED ORDER — ROSUVASTATIN CALCIUM 10 MG PO TABS: ORAL_TABLET | ORAL | 3 refills | Status: DC

## 2021-06-28 MED ORDER — LEVOTHYROXINE SODIUM 88 MCG PO TABS: ORAL_TABLET | ORAL | 3 refills | Status: DC

## 2021-06-28 MED ORDER — BISOPROLOL-HYDROCHLOROTHIAZIDE 2.5-6.25 MG PO TABS: ORAL_TABLET | ORAL | 3 refills | Status: DC

## 2021-06-28 NOTE — Patient Instructions (Signed)
[facility] CT: [phone] please call to set up follow up lung CT ?

## 2021-06-28 NOTE — Progress Notes (Signed)
?Marissa Shah ?DOB: 07-Apr-1945 ?Encounter date: 06/28/2021 ? ?This is a 76 y.o. female who presents with ?Chief Complaint  ?Patient presents with  ? Follow-up  ? ? ?History of present illness: ?Last visit in the office was in November for follow-up on cough status post-COVID infection.  Chest x-ray was obtained at that time which was stable.  She did have a lung nodule noted on CT of the chest in May when she was recommended to have a 25-monthCT follow-up to reassess. ? ?HTN: Ziac 2.5-6.25 mg daily. Not checking at home, but good about taking medications. No headaches/light headedness.  ?HL: crestor '10mg'$ ; does well with this; no cramps.  ?Hypothyroid: Synthroid 88 mcg daily ? ? ?Allergies  ?Allergen Reactions  ? Elemental Sulfur   ?  Burning in throat!  ? Sulfamethoxazole Other (See Comments)  ?  Patient feels like her mouth is on fire ?Patient feels like her mouth is on fire  ? ?Current Meds  ?Medication Sig  ? Calcium Carbonate-Vitamin D (CALTRATE 600+D PO) Take by mouth daily.  ? predniSONE (DELTASONE) 10 MG tablet 40 mg x 3 days, 20 mg x 3 days, 10 mg x 3 days  ? Vitamin D, Ergocalciferol, (DRISDOL) 1.25 MG (50000 UNIT) CAPS capsule Take 1 capsule (50,000 Units total) by mouth every 7 (seven) days.  ? [DISCONTINUED] bisoprolol-hydrochlorothiazide (ZIAC) 2.5-6.25 MG tablet TAKE 1 TABLET BY MOUTH ONCE DAILY --PT  NEEDS  TO  SCHEDULE  PHYSICAL  EXAM  ? [DISCONTINUED] EUTHYROX 88 MCG tablet TAKE 1 TABLET BY MOUTH ONCE DAILY BEFORE BREAKFAST --PT  NEEDS  TO  SCHEDULE  PHYSICAL  EXAM  ? [DISCONTINUED] lisinopril (ZESTRIL) 5 MG tablet TAKE 1 TABLET BY MOUTH ONCE DAILY --PT  NEEDS  TO  SCHEDULE  PHYSICAL  EXAM  ? [DISCONTINUED] rosuvastatin (CRESTOR) 10 MG tablet TAKE 1 TABLET BY MOUTH ONCE DAILY --PT  NEEDS  TO  SCHEDULE  PHYSICAL  EXAM  ? ? ?Review of Systems  ?Constitutional:  Negative for chills, fatigue and fever.  ?Respiratory:  Negative for cough, chest tightness, shortness of breath and wheezing.    ?Cardiovascular:  Negative for chest pain, palpitations and leg swelling.  ? ?Objective: ? ?BP 118/60 (BP Location: Left Arm, Patient Position: Sitting, Cuff Size: Large)   Pulse 70   Temp 98 ?F (36.7 ?C) (Oral)   Ht '5\' 1"'$  (1.549 m)   Wt 179 lb 14.4 oz (81.6 kg)   SpO2 96%   BMI 33.99 kg/m?   Weight: 179 lb 14.4 oz (81.6 kg)  ? ?BP Readings from Last 3 Encounters:  ?06/28/21 118/60  ?01/30/21 124/70  ?07/30/20 122/74  ? ?Wt Readings from Last 3 Encounters:  ?06/28/21 179 lb 14.4 oz (81.6 kg)  ?01/30/21 177 lb (80.3 kg)  ?07/30/20 177 lb (80.3 kg)  ? ? ?Physical Exam ?Constitutional:   ?   General: She is not in acute distress. ?   Appearance: She is well-developed.  ?Cardiovascular:  ?   Rate and Rhythm: Normal rate and regular rhythm.  ?   Heart sounds: Normal heart sounds. No murmur heard. ?  No friction rub.  ?Pulmonary:  ?   Effort: Pulmonary effort is normal. No respiratory distress.  ?   Breath sounds: Normal breath sounds. No wheezing or rales.  ?Musculoskeletal:  ?   Right lower leg: No edema.  ?   Left lower leg: No edema.  ?Neurological:  ?   Mental Status: She is alert and oriented to person, place,  and time.  ?Psychiatric:     ?   Behavior: Behavior normal.  ? ? ?Assessment/Plan ? ?1. Primary hypertension ?Well controlled. Continue with current medication.  ? ?2. Hypothyroidism, unspecified type ?Has been stable on the euthyrox; recheck today ? ?3. Hyperlipidemia, unspecified hyperlipidemia type ?Recheck today. Has tolerated crestor '10mg'$  daily. ? ?4. Encounter for screening mammogram for malignant neoplasm of breast ?- MM DIGITAL SCREENING BILATERAL; Future ? ?5. Estrogen deficiency ?- DG Bone Density; Future ? ?Return in about 6 months (around 12/28/2021) for physical exam. ?She will call to set up colonoscopy. She knows she is due.  ? ? ? ? ? ?Micheline Rough, MD ?

## 2021-07-16 ENCOUNTER — Telehealth: Payer: Self-pay | Admitting: Family Medicine

## 2021-07-16 NOTE — Telephone Encounter (Signed)
Left message for patient to call back and schedule Medicare Annual Wellness Visit (AWV) either virtually or in office. Left  my Herbie Drape number [phone] ? ? ?Last AWV 06/26/20 ? please schedule at anytime with [facility] Nurse Health Advisor 1 or 2 ? ? ? ?

## 2021-07-31 ENCOUNTER — Other Ambulatory Visit: Payer: Self-pay | Admitting: Acute Care

## 2021-07-31 ENCOUNTER — Ambulatory Visit (INDEPENDENT_AMBULATORY_CARE_PROVIDER_SITE_OTHER)
Admission: RE | Admit: 2021-07-31 | Discharge: 2021-07-31 | Disposition: A | Discharge status: Home / Self Care | Payer: PPO | Source: Ambulatory Visit | Attending: Acute Care | Admitting: Acute Care

## 2021-07-31 DIAGNOSIS — Z87891 Personal history of nicotine dependence: Secondary | ICD-10-CM

## 2021-08-01 ENCOUNTER — Other Ambulatory Visit: Payer: Self-pay

## 2021-08-01 DIAGNOSIS — Z87891 Personal history of nicotine dependence: Secondary | ICD-10-CM

## 2021-08-01 DIAGNOSIS — Z122 Encounter for screening for malignant neoplasm of respiratory organs: Secondary | ICD-10-CM

## 2021-08-12 ENCOUNTER — Telehealth: Payer: Self-pay | Admitting: Family Medicine

## 2021-08-12 NOTE — Telephone Encounter (Signed)
Left message for patient to call back and schedule Medicare Annual Wellness Visit (AWV) either virtually or in office. Left  my Herbie Drape number [phone] ? ? ?Last AWV 06/26/20 ?; please schedule at anytime with [facility] Nurse Health Advisor 1 or 2 ? ? ? ?

## 2021-09-09 ENCOUNTER — Ambulatory Visit (INDEPENDENT_AMBULATORY_CARE_PROVIDER_SITE_OTHER): Payer: PPO

## 2021-09-09 VITALS — Ht 61.0 in | Wt 179.0 lb

## 2021-09-09 DIAGNOSIS — Z0000 Encounter for general adult medical examination without abnormal findings: Secondary | ICD-10-CM

## 2021-09-09 NOTE — Progress Notes (Addendum)
Subjective:   Marissa Shah is a 76 y.o. female who presents for Medicare Annual (Subsequent) preventive examination.  Review of Systems    Virtual Visit via Telephone Note  I connected with  Marissa Shah on 09/09/21 at  8:45 AM EDT by telephone and verified that I am speaking with the correct person using two identifiers.  Location: Patient: Home Provider: Office Persons participating in the virtual visit: patient/Nurse Health Advisor   I discussed the limitations, risks, security and privacy concerns of performing an evaluation and management service by telephone and the availability of in person appointments. The patient expressed understanding and agreed to proceed.  Interactive audio and video telecommunications were attempted between this nurse and patient, however failed, due to patient having technical difficulties OR patient did not have access to video capability.  We continued and completed visit with audio only.  Some vital signs may be absent or patient reported.   Criselda Peaches, LPN  Cardiac Risk Factors include: advanced age (>42mn, >>54women);hypertension     Objective:    Today's Vitals   09/09/21 0849  Weight: 179 lb (81.2 kg)  Height: '5\' 1"'$  (1.549 m)   Body mass index is 33.82 kg/m.     09/09/2021    8:55 AM 06/26/2020    9:50 AM  Advanced Directives  Does Patient Have a Medical Advance Directive? No No  Would patient like information on creating a medical advance directive? No - Patient declined No - Patient declined    Current Medications (verified) Outpatient Encounter Medications as of 09/09/2021  Medication Sig   bisoprolol-hydrochlorothiazide (ZIAC) 2.5-6.25 MG tablet TAKE 1 TABLET BY MOUTH ONCE DAILY   Calcium Carbonate-Vitamin D (CALTRATE 600+D PO) Take by mouth daily.   levothyroxine (EUTHYROX) 88 MCG tablet TAKE 1 TABLET BY MOUTH ONCE DAILY BEFORE BREAKFAST   lisinopril (ZESTRIL) 5 MG tablet TAKE 1 TABLET BY MOUTH ONCE DAILY    predniSONE (DELTASONE) 10 MG tablet 40 mg x 3 days, 20 mg x 3 days, 10 mg x 3 days   rosuvastatin (CRESTOR) 10 MG tablet TAKE 1 TABLET BY MOUTH ONCE DAILY   Vitamin D, Ergocalciferol, (DRISDOL) 1.25 MG (50000 UNIT) CAPS capsule Take 1 capsule (50,000 Units total) by mouth every 7 (seven) days.   No facility-administered encounter medications on file as of 09/09/2021.    Allergies (verified) Elemental sulfur and Sulfamethoxazole   History: Past Medical History:  Diagnosis Date   Hypertension    Thyroid disease    Past Surgical History:  Procedure Laterality Date   ABDOMINAL HYSTERECTOMY     broken arm     left wrist/has screws and plates   Goiter  [id number]  removed   Family History  Problem Relation Age of Onset   Other Mother        died from flu   Other Father        TB, multiple health issues   Hearing loss Father    Hypertension Brother    Hearing loss Brother    Alcohol abuse Brother    Alcohol abuse Brother    Colon cancer Neg Hx    Social History   Socioeconomic History   Marital status: Married    Spouse name: Not on file   Number of children: Not on file   Years of education: Not on file   Highest education level: Not on file  Occupational History   Not on file  Tobacco Use   Smoking status:  Former    Packs/day: 2.50    Years: 25.00    Total pack years: 62.50    Types: Cigarettes    Quit date: 08/13/2006    Years since quitting: 15.0   Smokeless tobacco: Never  Substance and Sexual Activity   Alcohol use: No    Alcohol/week: 0.0 standard drinks of alcohol   Drug use: No   Sexual activity: Not on file  Other Topics Concern   Not on file  Social History Narrative   Not on file   Social Determinants of Health   Financial Resource Strain: Low Risk  (09/09/2021)   Overall Financial Resource Strain (CARDIA)    Difficulty of Paying Living Expenses: Not hard at all  Food Insecurity: No Food Insecurity (09/09/2021)   Hunger Vital Sign    Worried  About Running Out of Food in the Last Year: Never true    Ran Out of Food in the Last Year: Never true  Transportation Needs: No Transportation Needs (09/09/2021)   PRAPARE - Hydrologist (Medical): No    Lack of Transportation (Non-Medical): No  Physical Activity: Inactive (09/09/2021)   Exercise Vital Sign    Days of Exercise per Week: 0 days    Minutes of Exercise per Session: 0 min  Stress: No Stress Concern Present (09/09/2021)   [place]    Feeling of Stress : Not at all  Social Connections: Moderately Isolated (09/09/2021)   Social Connection and Isolation Panel [NHANES]    Frequency of Communication with Friends and Family: More than three times a week    Frequency of Social Gatherings with Friends and Family: More than three times a week    Attends Religious Services: Never    Marine scientist or Organizations: No    Attends Archivist Meetings: Never    Marital Status: Married     Clinical Intake:  Pre-visit preparation completed: No  Diabetic?  No  Information entered by :: Rolene Arbour LPN   Activities of Daily Living    09/09/2021    8:54 AM  In your present state of health, do you have any difficulty performing the following activities:  Hearing? 0  Vision? 0  Difficulty concentrating or making decisions? 0  Walking or climbing stairs? 0  Dressing or bathing? 0  Doing errands, shopping? 0  Preparing Food and eating ? N  Using the Toilet? N  In the past six months, have you accidently leaked urine? N  Do you have problems with loss of bowel control? N  Managing your Medications? N  Managing your Finances? N  Housekeeping or managing your Housekeeping? N    Patient Care Team: Caren Macadam, MD (Inactive) as PCP - General (Family Medicine)  Indicate any recent Medical Services you may have received from other than [facility] providers in the  past year (date may be approximate).     Assessment:   This is a routine wellness examination for [name].  Hearing/Vision screen Hearing Screening - Comments:: No difficulty hearing Vision Screening - Comments:: Wears glasses followed by [place] issues and exercise activities discussed: Exercise limited by: None identified   Goals Addressed               This Visit's Progress     No current goals (pt-stated)         Depression Screen    09/09/2021  8:52 AM 06/28/2021    8:38 AM 06/26/2020    9:53 AM 01/11/2020    9:21 AM  PHQ 2/9 Scores  PHQ - 2 Score 0 0 0 0  PHQ- 9 Score  1 0     Fall Risk    09/09/2021    8:54 AM 06/28/2021    8:34 AM 06/26/2020    9:52 AM 01/11/2020    9:21 AM  Fall Risk   Falls in the past year? 0 0 0 0  Number falls in past yr: 0 0 0 0  Injury with Fall? 0 0 0   Risk for fall due to : No Fall Risks No Fall Risks No Fall Risks   Follow up  Falls evaluation completed Falls evaluation completed;Falls prevention discussed     FALL RISK PREVENTION PERTAINING TO THE HOME:  Any stairs in or around the home? Yes  If so, are there any without handrails? No  Home free of loose throw rugs in walkways, pet beds, electrical cords, etc? Yes  Adequate lighting in your home to reduce risk of falls? Yes   ASSISTIVE DEVICES UTILIZED TO PREVENT FALLS:  Life alert? No  Use of a cane, walker or w/c? No  Grab bars in the bathroom? No  Shower chair or bench in shower? No  Elevated toilet seat or a handicapped toilet? No   TIMED UP AND GO:  Was the test performed? No . Audio Visit  Cognitive Function:        09/09/2021    8:55 AM  6CIT Screen  What Year? 0 points  What month? 0 points  What time? 0 points  Count back from 20 0 points  Months in reverse 0 points  Repeat phrase 0 points  Total Score 0 points    Immunizations Immunization History  Administered Date(s) Administered   Fluad Quad(high Dose 65+)  01/11/2020   Influenza, High Dose Seasonal PF 01/29/2019   Influenza-Unspecified 01/30/2019, 12/29/2020   Moderna Sars-Covid-2 Vaccination 05/05/2019, 06/01/2019, 02/29/2020   Pneumococcal Polysaccharide-23 01/11/2020    TDAP status: Due, Education has been provided regarding the importance of this vaccine. Advised may receive this vaccine at local pharmacy or Health Dept. Aware to provide a copy of the vaccination record if obtained from local pharmacy or Health Dept. Verbalized acceptance and understanding.  Flu Vaccine status: Up to date  Pneumococcal vaccine status: Declined,  Education has been provided regarding the importance of this vaccine but patient still declined. Advised may receive this vaccine at local pharmacy or Health Dept. Aware to provide a copy of the vaccination record if obtained from local pharmacy or Health Dept. Verbalized acceptance and understanding.   Covid-19 vaccine status: Completed vaccines  Qualifies for Shingles Vaccine? Yes   Zostavax completed No   Shingrix Completed?: No.    Education has been provided regarding the importance of this vaccine. Patient has been advised to call insurance company to determine out of pocket expense if they have not yet received this vaccine. Advised may also receive vaccine at local pharmacy or Health Dept. Verbalized acceptance and understanding.  Screening Tests Health Maintenance  Topic Date Due   DEXA SCAN  Never done   MAMMOGRAM  09/27/2021 (Originally 03/31/2018)   COLONOSCOPY (Pts 45-80yr Insurance coverage will need to be confirmed)  09/27/2021 (Originally 04/18/2020)   Zoster Vaccines- Shingrix (1 of 2) 12/28/2021 (Originally 01/20/1996)   TETANUS/TDAP  06/29/2022 (Originally 01/19/1965)   Pneumonia Vaccine 76 Years old (2 - PCV) 09/10/2022 (Originally  01/10/2021)   INFLUENZA VACCINE  10/29/2021   Hepatitis C Screening  Completed   HPV VACCINES  Aged Out   COVID-19 Vaccine  Discontinued    Health  Maintenance  Health Maintenance Due  Topic Date Due   DEXA SCAN  Never done    Colorectal cancer screening: Type of screening: Colonoscopy. Completed 04/19/15. Repeat every 5 years  Mammogram status: Ordered 06/28/21. Pt provided with contact info and advised to call to schedule appt.   Bone Density status: Ordered 06/28/21. Pt provided with contact info and advised to call to schedule appt.  Lung Cancer Screening: (Low Dose CT Chest recommended if Age 69-80 years, 30 pack-year currently smoking OR have quit w/in 15years.) does not qualify.     Additional Screening:  Hepatitis C Screening: does qualify; Completed 01/11/20  Vision Screening: Recommended annual ophthalmology exams for early detection of glaucoma and other disorders of the eye. Is the patient up to date with their annual eye exam?  Yes  Who is the provider or what is the name of the office in which the patient attends annual eye exams? [place] If pt is not established with a provider, would they like to be referred to a provider to establish care? No .   Dental Screening: Recommended annual dental exams for proper oral hygiene  Community Resource Referral / Chronic Care Management:  CRR required this visit?  No   CCM required this visit?  No      Plan:     I have personally reviewed and noted the following in the patient's chart:   Medical and social history Use of alcohol, tobacco or illicit drugs  Current medications and supplements including opioid prescriptions.  Functional ability and status Nutritional status Physical activity Advanced directives List of other physicians Hospitalizations, surgeries, and ER visits in previous 12 months Vitals Screenings to include cognitive, depression, and falls Referrals and appointments  In addition, I have reviewed and discussed with patient certain preventive protocols, quality metrics, and best practice recommendations. A written personalized care  plan for preventive services as well as general preventive health recommendations were provided to patient.     Criselda Peaches, LPN   0/32/1224   Nurse Notes: None

## 2021-09-09 NOTE — Patient Instructions (Addendum)
[name] , Thank you for taking time to come for your Medicare Wellness Visit. I appreciate your ongoing commitment to your health goals. Please review the following plan we discussed and let me know if I can assist you in the future.   These are the goals we discussed:  Goals       Exercise 3x per week (30 min per time)      I would like to walk outside when the weather gets warm      No current goals (pt-stated)      Weight (lb) < 150 lb (68 kg)        This is a list of the screening recommended for you and due dates:  Health Maintenance  Topic Date Due   DEXA scan (bone density measurement)  Never done   Mammogram  09/27/2021*   Colon Cancer Screening  09/27/2021*   Zoster (Shingles) Vaccine (1 of 2) 12/28/2021*   Tetanus Vaccine  06/29/2022*   Pneumonia Vaccine (2 - PCV) 09/10/2022*   Flu Shot  10/29/2021   Hepatitis C Screening: USPSTF Recommendation to screen - Ages 18-79 yo.  Completed   HPV Vaccine  Aged Out   COVID-19 Vaccine  Discontinued  *Topic was postponed. The date shown is not the original due date.    Advanced directives: No  Conditions/risks identified: None  Next appointment: Follow up in one year for your annual wellness visit     Preventive Care 65 Years and Older, Female Preventive care refers to lifestyle choices and visits with your health care provider that can promote health and wellness. What does preventive care include? A yearly physical exam. This is also called an annual well check. Dental exams once or twice a year. Routine eye exams. Ask your health care provider how often you should have your eyes checked. Personal lifestyle choices, including: Daily care of your teeth and gums. Regular physical activity. Eating a healthy diet. Avoiding tobacco and drug use. Limiting alcohol use. Practicing safe sex. Taking low-dose aspirin every day. Taking vitamin and mineral supplements as recommended by your health care provider. What  happens during an annual well check? The services and screenings done by your health care provider during your annual well check will depend on your age, overall health, lifestyle risk factors, and family history of disease. Counseling  Your health care provider may ask you questions about your: Alcohol use. Tobacco use. Drug use. Emotional well-being. Home and relationship well-being. Sexual activity. Eating habits. History of falls. Memory and ability to understand (cognition). Work and work Statistician. Reproductive health. Screening  You may have the following tests or measurements: Height, weight, and BMI. Blood pressure. Lipid and cholesterol levels. These may be checked every 5 years, or more frequently if you are over 27 years old. Skin check. Lung cancer screening. You may have this screening every year starting at age 73 if you have a 30-pack-year history of smoking and currently smoke or have quit within the past 15 years. Fecal occult blood test (FOBT) of the stool. You may have this test every year starting at age 38. Flexible sigmoidoscopy or colonoscopy. You may have a sigmoidoscopy every 5 years or a colonoscopy every 10 years starting at age 28. Hepatitis C blood test. Hepatitis B blood test. Sexually transmitted disease (STD) testing. Diabetes screening. This is done by checking your blood sugar (glucose) after you have not eaten for a while (fasting). You may have this done every 1-3 years. Bone density scan.  This is done to screen for osteoporosis. You may have this done starting at age 29. Mammogram. This may be done every 1-2 years. Talk to your health care provider about how often you should have regular mammograms. Talk with your health care provider about your test results, treatment options, and if necessary, the need for more tests. Vaccines  Your health care provider may recommend certain vaccines, such as: Influenza vaccine. This is recommended every  year. Tetanus, diphtheria, and acellular pertussis (Tdap, Td) vaccine. You may need a Td booster every 10 years. Zoster vaccine. You may need this after age 35. Pneumococcal 13-valent conjugate (PCV13) vaccine. One dose is recommended after age 23. Pneumococcal polysaccharide (PPSV23) vaccine. One dose is recommended after age 34. Talk to your health care provider about which screenings and vaccines you need and how often you need them. This information is not intended to replace advice given to you by your health care provider. Make sure you discuss any questions you have with your health care provider. Document Released: 04/13/2015 Document Revised: 12/05/2015 Document Reviewed: 01/16/2015 Elsevier Interactive Patient Education  2017 [place] Prevention in the Home Falls can cause injuries. They can happen to people of all ages. There are many things you can do to make your home safe and to help prevent falls. What can I do on the outside of my home? Regularly fix the edges of walkways and driveways and fix any cracks. Remove anything that might make you trip as you walk through a door, such as a raised step or threshold. Trim any bushes or trees on the path to your home. Use bright outdoor lighting. Clear any walking paths of anything that might make someone trip, such as rocks or tools. Regularly check to see if handrails are loose or broken. Make sure that both sides of any steps have handrails. Any raised decks and porches should have guardrails on the edges. Have any leaves, snow, or ice cleared regularly. Use sand or salt on walking paths during winter. Clean up any spills in your garage right away. This includes oil or grease spills. What can I do in the bathroom? Use night lights. Install grab bars by the toilet and in the tub and shower. Do not use towel bars as grab bars. Use non-skid mats or decals in the tub or shower. If you need to sit down in the shower, use a  plastic, non-slip stool. Keep the floor dry. Clean up any water that spills on the floor as soon as it happens. Remove soap buildup in the tub or shower regularly. Attach bath mats securely with double-sided non-slip rug tape. Do not have throw rugs and other things on the floor that can make you trip. What can I do in the bedroom? Use night lights. Make sure that you have a light by your bed that is easy to reach. Do not use any sheets or blankets that are too big for your bed. They should not hang down onto the floor. Have a firm chair that has side arms. You can use this for support while you get dressed. Do not have throw rugs and other things on the floor that can make you trip. What can I do in the kitchen? Clean up any spills right away. Avoid walking on wet floors. Keep items that you use a lot in easy-to-reach places. If you need to reach something above you, use a strong step stool that has a grab bar. Keep electrical cords  out of the way. Do not use floor polish or wax that makes floors slippery. If you must use wax, use non-skid floor wax. Do not have throw rugs and other things on the floor that can make you trip. What can I do with my stairs? Do not leave any items on the stairs. Make sure that there are handrails on both sides of the stairs and use them. Fix handrails that are broken or loose. Make sure that handrails are as long as the stairways. Check any carpeting to make sure that it is firmly attached to the stairs. Fix any carpet that is loose or worn. Avoid having throw rugs at the top or bottom of the stairs. If you do have throw rugs, attach them to the floor with carpet tape. Make sure that you have a light switch at the top of the stairs and the bottom of the stairs. If you do not have them, ask someone to add them for you. What else can I do to help prevent falls? Wear shoes that: Do not have high heels. Have rubber bottoms. Are comfortable and fit you  well. Are closed at the toe. Do not wear sandals. If you use a stepladder: Make sure that it is fully opened. Do not climb a closed stepladder. Make sure that both sides of the stepladder are locked into place. Ask someone to hold it for you, if possible. Clearly mark and make sure that you can see: Any grab bars or handrails. First and last steps. Where the edge of each step is. Use tools that help you move around (mobility aids) if they are needed. These include: Canes. Walkers. Scooters. Crutches. Turn on the lights when you go into a dark area. Replace any light bulbs as soon as they burn out. Set up your furniture so you have a clear path. Avoid moving your furniture around. If any of your floors are uneven, fix them. If there are any pets around you, be aware of where they are. Review your medicines with your doctor. Some medicines can make you feel dizzy. This can increase your chance of falling. Ask your doctor what other things that you can do to help prevent falls. This information is not intended to replace advice given to you by your health care provider. Make sure you discuss any questions you have with your health care provider. Document Released: 01/11/2009 Document Revised: 08/23/2015 Document Reviewed: 04/21/2014 Elsevier Interactive Patient Education  2017 [employer].

## 2022-04-12 IMAGING — CT CT CHEST LUNG CANCER SCREENING LOW DOSE W/O CM
2 of 4 series · 15 of 36 positions shown, 18 images · non-contrast
Comparison: None.

CLINICAL DATA: 74-year-old asymptomatic female former smoker with
69 pack-year smoking history, quit smoking 10 years prior.

EXAM:
CT CHEST WITHOUT CONTRAST LOW-DOSE FOR LUNG CANCER SCREENING
TECHNIQUE: Multidetector CT imaging of the chest was performed following the
standard protocol without IV contrast.

[Series 3: lung thins 1.0 · axial · 0.79mm/px · z∈[-411,-152]mm · 12 of 285 slices shown, 15 images]
[im 13/285  mediastinal]
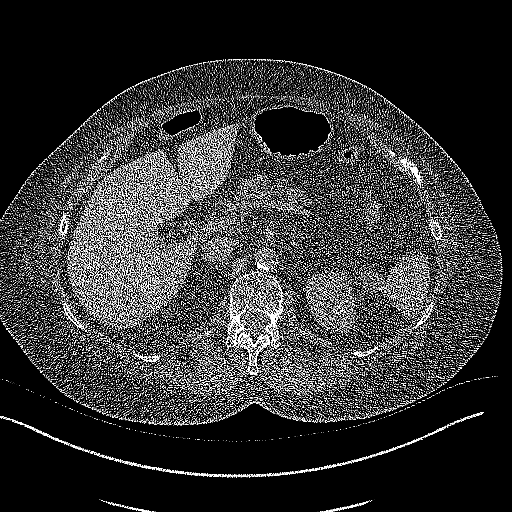
[im 13/285  lung]
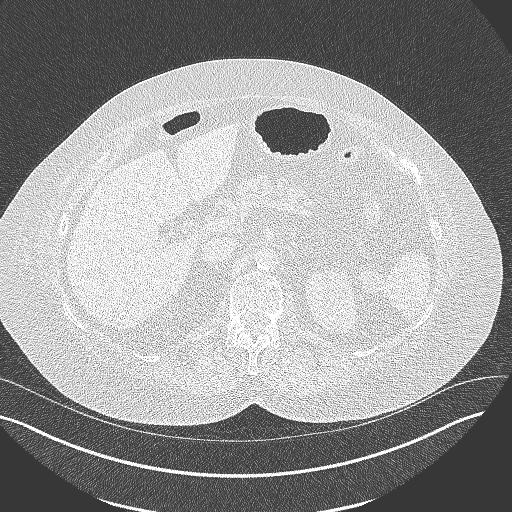
[im 39/285  lung]
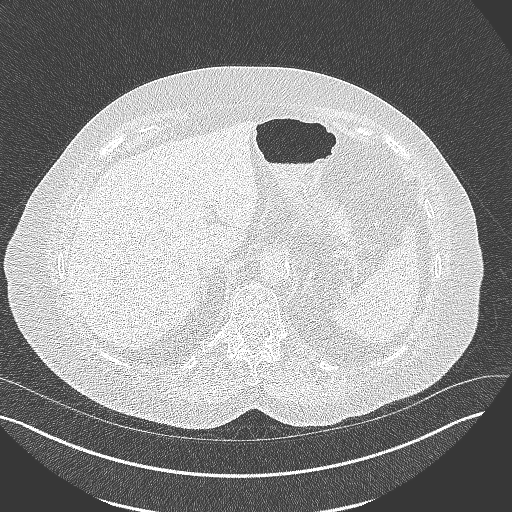
[im 65/285  lung]
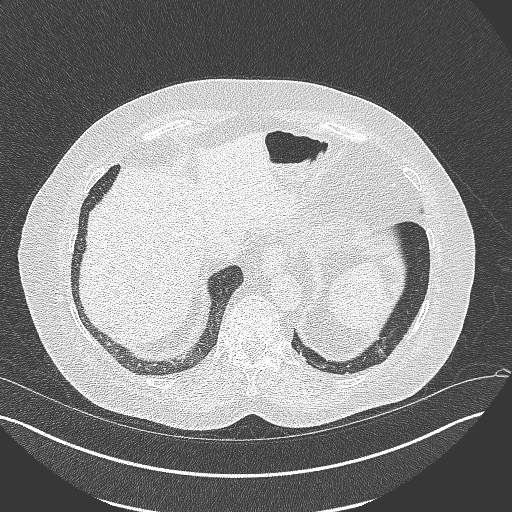
[im 91/285  lung]
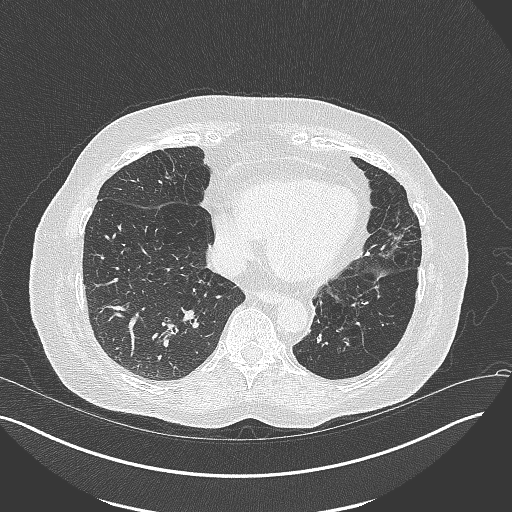
[im 104/285  mediastinal]
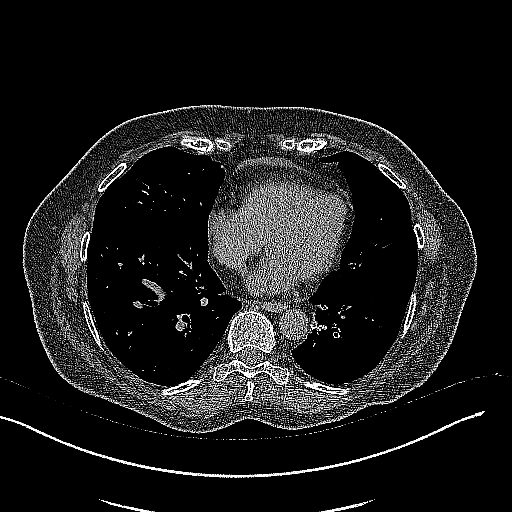
[im 104/285  lung]
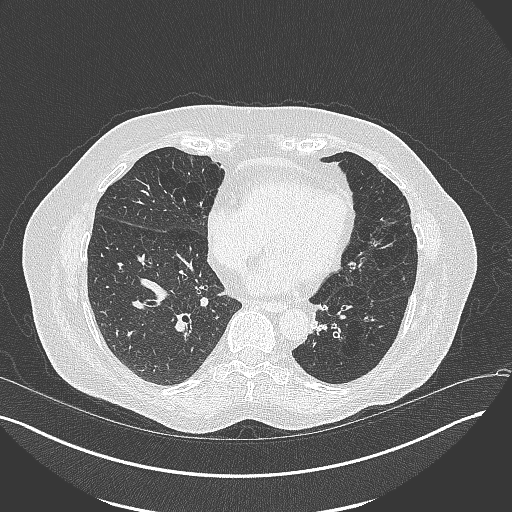
[im 130/285  lung]
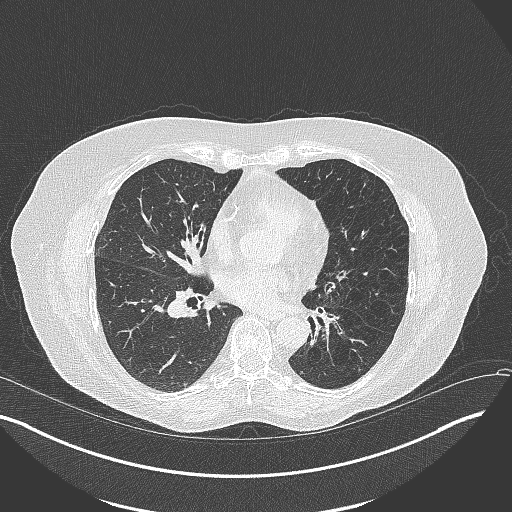
[im 155/285  lung]
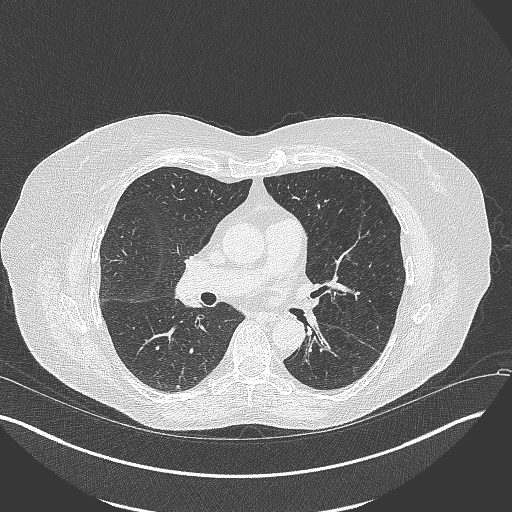
[im 181/285  lung]
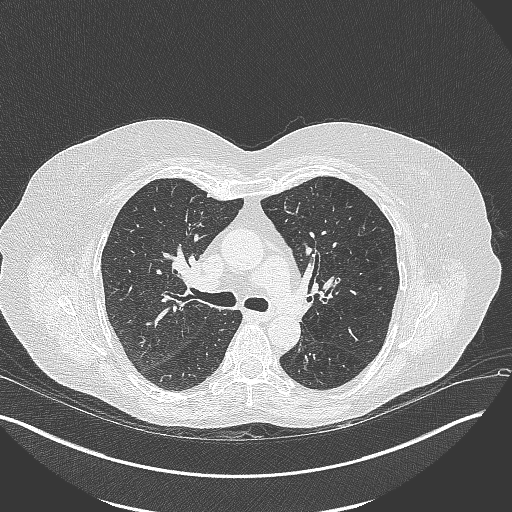
[im 194/285  mediastinal]
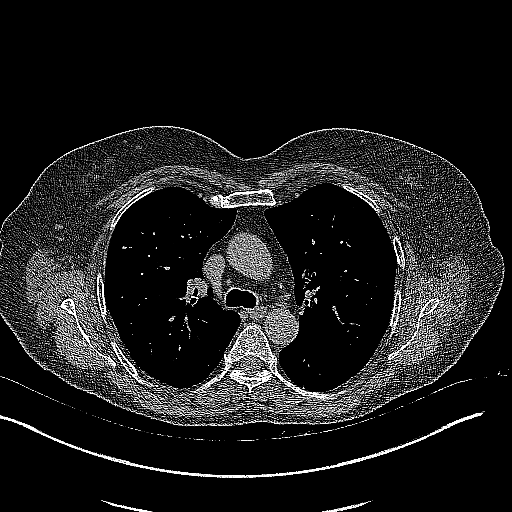
[im 194/285  lung]
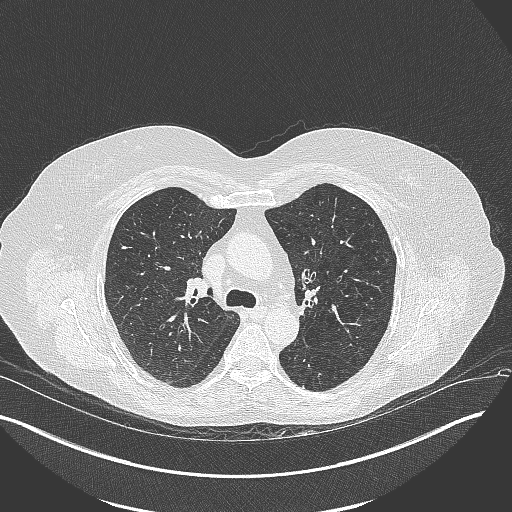
[im 220/285  lung]
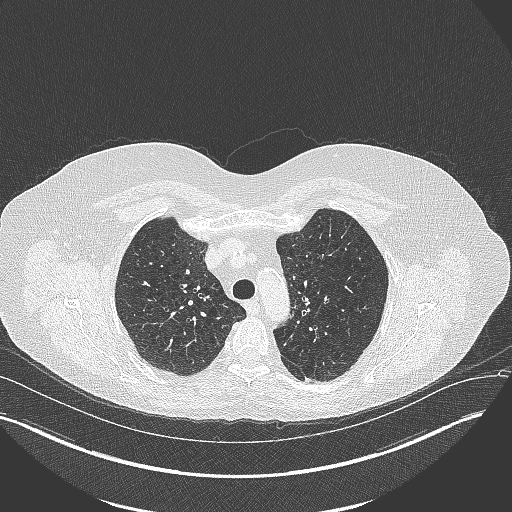
[im 246/285  lung]
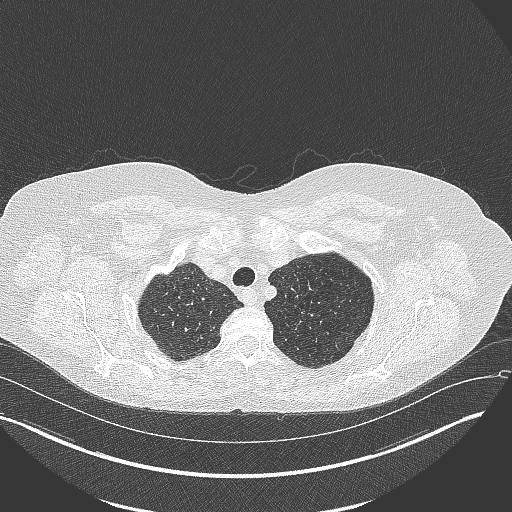
[im 272/285  lung]
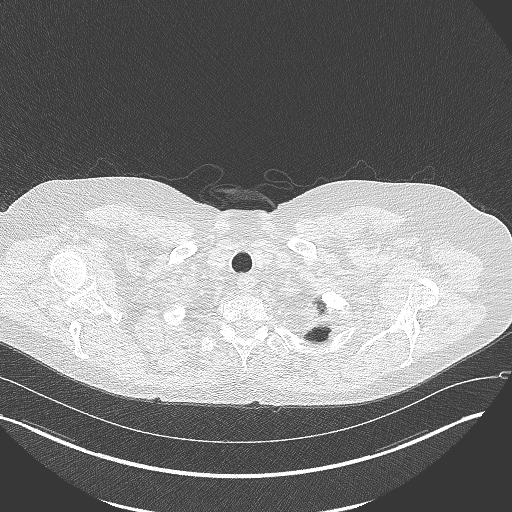

[Series 6: coronal · coronal · 0.58mm/px · 3 of 128 slices shown]
[im 26/128  lung]
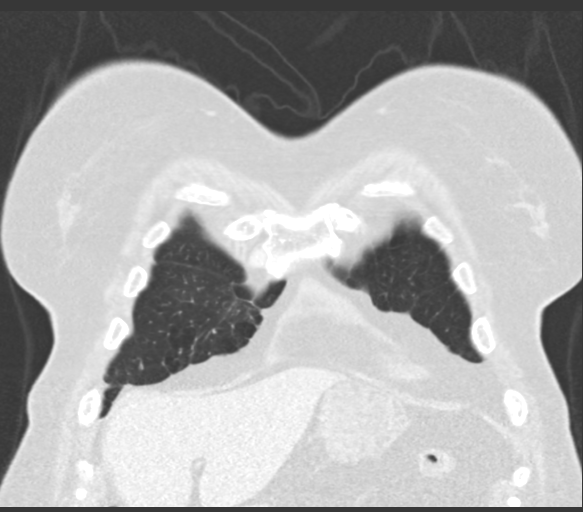
[im 51/128  lung]
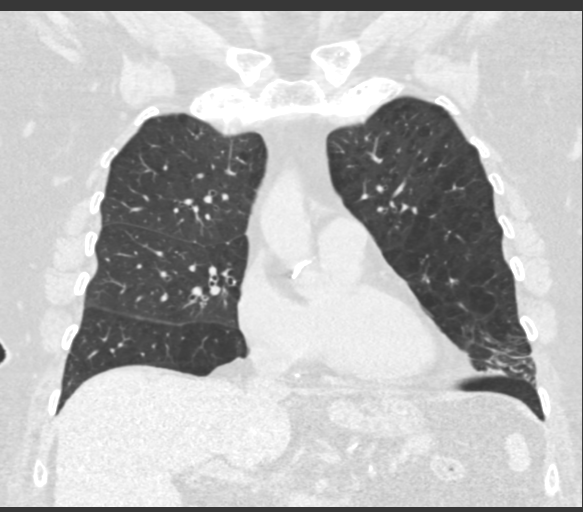
[im 77/128  lung]
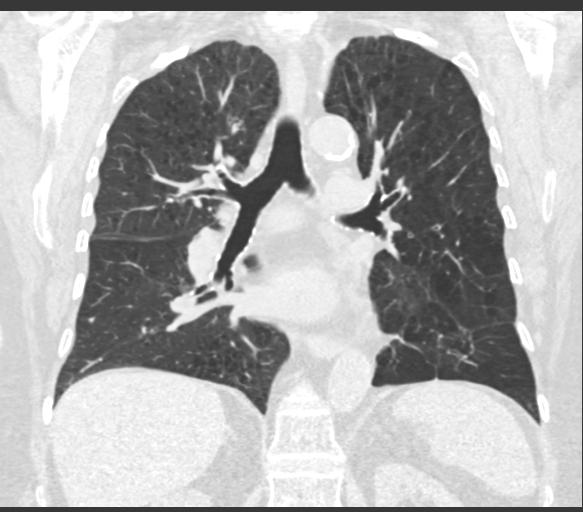

[15 of 36 positions shown; findings below may reference images not displayed]

FINDINGS: Cardiovascular: Normal heart size. Trace pericardial
effusion/thickening. Three-vessel coronary atherosclerosis.
Atherosclerotic nonaneurysmal thoracic aorta. Normal caliber
pulmonary arteries.

Mediastinum/Nodes: No discrete thyroid nodules. Unremarkable
esophagus. No pathologically enlarged axillary, mediastinal or hilar
lymph nodes, noting limited sensitivity for the detection of hilar
adenopathy on this noncontrast study.

Lungs/Pleura: No pneumothorax. No pleural effusion. Marked
centrilobular emphysema with diffuse bronchial wall thickening. No
acute consolidative airspace disease or lung masses. Numerous
scattered solid pulmonary nodules in both lungs, largest 6.7 mm in
volume derived mean diameter in the posterior right lower lobe
(series 3/image 150).

Upper abdomen: No acute abnormality.

Musculoskeletal: No aggressive appearing focal osseous lesions.
Moderate T11 vertebral compression fracture of uncertain chronicity,
chronic appearing. Mild thoracic spondylosis.
IMPRESSION: 1. Lung-RADS 3, probably benign findings. Dominant 6.7 mm right
lower lobe solid pulmonary nodule. Short-term follow-up in 6 months
is recommended with repeat low-dose chest CT without contrast
(please use the following order, "CT CHEST LCS NODULE FOLLOW-UP W/O
CM").
2. Three-vessel coronary atherosclerosis.
3. Moderate T11 vertebral compression fracture of uncertain
chronicity, chronic appearing.
4. Aortic Atherosclerosis ([id number]-M9Z.Z) and Emphysema ([id number]-PPU.7).

## 2022-07-04 ENCOUNTER — Encounter: Payer: Self-pay | Admitting: Family Medicine

## 2022-07-04 ENCOUNTER — Ambulatory Visit (INDEPENDENT_AMBULATORY_CARE_PROVIDER_SITE_OTHER): Payer: PPO | Admitting: Family Medicine

## 2022-07-04 VITALS — BP 98/50 | HR 75 | Temp 98.3°F | Ht 61.0 in | Wt 180.0 lb

## 2022-07-04 DIAGNOSIS — J449 Chronic obstructive pulmonary disease, unspecified: Secondary | ICD-10-CM | POA: Insufficient documentation

## 2022-07-04 DIAGNOSIS — E559 Vitamin D deficiency, unspecified: Secondary | ICD-10-CM

## 2022-07-04 DIAGNOSIS — I10 Essential (primary) hypertension: Secondary | ICD-10-CM | POA: Diagnosis not present

## 2022-07-04 DIAGNOSIS — E039 Hypothyroidism, unspecified: Secondary | ICD-10-CM | POA: Diagnosis not present

## 2022-07-04 DIAGNOSIS — Z780 Asymptomatic menopausal state: Secondary | ICD-10-CM | POA: Diagnosis not present

## 2022-07-04 DIAGNOSIS — E785 Hyperlipidemia, unspecified: Secondary | ICD-10-CM

## 2022-07-04 MED ORDER — ALBUTEROL SULFATE HFA 108 (90 BASE) MCG/ACT IN AERS: 2.0000 | INHALATION_SPRAY | Freq: Four times a day (QID) | RESPIRATORY_TRACT | 2 refills | Status: DC | PRN

## 2022-07-04 MED ORDER — ROSUVASTATIN CALCIUM 10 MG PO TABS: ORAL_TABLET | ORAL | 3 refills | Status: DC

## 2022-07-04 MED ORDER — LISINOPRIL 5 MG PO TABS: ORAL_TABLET | ORAL | 3 refills | Status: DC

## 2022-07-04 MED ORDER — BISOPROLOL-HYDROCHLOROTHIAZIDE 2.5-6.25 MG PO TABS: ORAL_TABLET | ORAL | 3 refills | Status: DC

## 2022-07-04 MED ORDER — LEVOTHYROXINE SODIUM 88 MCG PO TABS: ORAL_TABLET | ORAL | 3 refills | Status: DC

## 2022-07-04 NOTE — Progress Notes (Signed)
Established Patient Office Visit  Subjective   Patient ID: Marissa Shah, female    DOB: December 23, 1945  Age: 77 y.o. MRN: [id number]  Chief Complaint  Patient presents with  . Establish Care  . Shortness of Breath    Noted when walking x2 years    Pt is here for TOC visit.  SOB-- pt states that if she exerts herself when she is exercising, states that she is worried about her lungs. She reports she has a long smoking history. States that she did quit smoking several years ago but sometimes she worries about how well she is breathing. No chest pain or pressure, no coughing or mucus production. States that since she quit smoking she hasn't gotten sick. She also think it might be from the weight gain, states she gained some weight since she went through "the   Current Outpatient Medications  Medication Instructions  . albuterol (VENTOLIN HFA) 108 (90 Base) MCG/ACT inhaler 2 puffs, Inhalation, Every 6 hours PRN  . bisoprolol-hydrochlorothiazide (ZIAC) 2.5-6.25 MG tablet TAKE 1 TABLET BY MOUTH ONCE DAILY  . Calcium Carbonate-Vitamin D (CALTRATE 600+D PO) Daily  . levothyroxine (EUTHYROX) 88 MCG tablet TAKE 1 TABLET BY MOUTH ONCE DAILY BEFORE BREAKFAST  . lisinopril (ZESTRIL) 5 MG tablet TAKE 1 TABLET BY MOUTH ONCE DAILY  . rosuvastatin (CRESTOR) 10 MG tablet TAKE 1 TABLET BY MOUTH ONCE DAILY    Patient Active Problem List   Diagnosis Date Noted  . 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 HTN (hypertension) 05/15/2013  . Hyperlipidemia 05/15/2013  . Dyspnea 05/15/2013  . Hypothyroid 05/15/2013  . Meniere disease 05/15/2013      Review of Systems  All other systems reviewed and are negative.     Objective:     BP (!) 98/50 (BP Location: Left Arm, Patient Position: Sitting, Cuff Size: Normal)   Pulse 75   Temp 98.3 F (36.8 C) (Oral)   Ht 5\' 1"  (1.549 m)   Wt 180 lb (81.6 kg)   SpO2 93%   BMI 34.01 kg/m  {Vitals History (Optional):23777}  Physical Exam Vitals reviewed.   Constitutional:      Appearance: Normal appearance. She is well-groomed and normal weight.  Eyes:     Conjunctiva/sclera: Conjunctivae normal.  Neck:     Thyroid: No thyromegaly.  Cardiovascular:     Rate and Rhythm: Normal rate and regular rhythm.     Pulses: Normal pulses.     Heart sounds: S1 normal and S2 normal.  Pulmonary:     Effort: Pulmonary effort is normal.     Breath sounds: Normal breath sounds and air entry.  Abdominal:     General: Bowel sounds are normal.  Musculoskeletal:     Right lower leg: No edema.     Left lower leg: No edema.  Neurological:     Mental Status: She is alert and oriented to person, place, and time. Mental status is at baseline.     Gait: Gait is intact.  Psychiatric:        Mood and Affect: Mood and affect normal.        Speech: Speech normal.        Behavior: Behavior normal.        Judgment: Judgment normal.     No results found for any visits on 07/04/22.  {Labs (Optional):23779}  The 10-year ASCVD risk score (Arnett DK, et al., 2019) is: 14%    Assessment & Plan:   Problem List Items Addressed This Visit  Unprioritized   Hyperlipidemia   Relevant Medications   bisoprolol-hydrochlorothiazide (ZIAC) 2.5-6.25 MG tablet   lisinopril (ZESTRIL) 5 MG tablet   rosuvastatin (CRESTOR) 10 MG tablet   Other Relevant Orders   Lipid Panel   Hypothyroid   Relevant Medications   levothyroxine (EUTHYROX) 88 MCG tablet   Other Relevant Orders   TSH   COPD, mild - Primary   Relevant Medications   albuterol (VENTOLIN HFA) 108 (90 Base) MCG/ACT inhaler   Other Visit Diagnoses     Essential hypertension       Relevant Medications   bisoprolol-hydrochlorothiazide (ZIAC) 2.5-6.25 MG tablet   lisinopril (ZESTRIL) 5 MG tablet   rosuvastatin (CRESTOR) 10 MG tablet   Other Relevant Orders   CMP   Vitamin D deficiency       Relevant Orders   Vitamin D, 25-hydroxy   Postmenopausal state       Relevant Orders   DG Bone Density        Return in about 1 year (around 07/04/2023) for HTN.    Karie Georges, MD

## 2022-07-05 LAB — COMPREHENSIVE METABOLIC PANEL
AG Ratio: 1.4 (calc) (ref 1.0–2.5)
ALT: 8 U/L (ref 6–29)
AST: 12 U/L (ref 10–35)
Albumin: 4.3 g/dL (ref 3.6–5.1)
Alkaline phosphatase (APISO): 71 U/L (ref 37–153)
BUN: 19 mg/dL (ref 7–25)
CO2: 30 mmol/L (ref 20–32)
Calcium: 9.2 mg/dL (ref 8.6–10.4)
Chloride: 102 mmol/L (ref 98–110)
Creat: 0.83 mg/dL (ref 0.60–1.00)
Globulin: 3 g/dL (calc) (ref 1.9–3.7)
Glucose, Bld: 93 mg/dL (ref 65–99)
Potassium: 4.1 mmol/L (ref 3.5–5.3)
Sodium: 142 mmol/L (ref 135–146)
Total Bilirubin: 0.3 mg/dL (ref 0.2–1.2)
Total Protein: 7.3 g/dL (ref 6.1–8.1)

## 2022-07-05 LAB — LIPID PANEL
Cholesterol: 174 mg/dL (ref <200)
HDL: 42 mg/dL — ABNORMAL LOW (ref >=50)
LDL Cholesterol (Calc): 93 mg/dL (calc)
Non-HDL Cholesterol (Calc): 132 mg/dL (calc) — ABNORMAL HIGH (ref <130)
Total CHOL/HDL Ratio: 4.1 (calc) (ref <5.0)
Triglycerides: 272 mg/dL — ABNORMAL HIGH (ref <150)

## 2022-07-05 LAB — TSH: TSH: 1.77 mIU/L (ref 0.40–4.50)

## 2022-07-05 LAB — VITAMIN D 25 HYDROXY (VIT D DEFICIENCY, FRACTURES): Vit D, 25-Hydroxy: 39 ng/mL (ref 30–100)

## 2022-07-09 NOTE — Assessment & Plan Note (Signed)
Needs new TSH today

## 2022-07-09 NOTE — Assessment & Plan Note (Signed)
No wheezing on exam, will give patient albuterol inhaler 2 puffs every 6 hours as needed. If this does not improve her SOB she was instructed to call me -- we may need cardiology referral to rule out any CAD.

## 2022-07-09 NOTE — Assessment & Plan Note (Signed)
On crestor 10 mg daily, needs new lipid panel today for surveillance.

## 2022-07-09 NOTE — Assessment & Plan Note (Signed)
Current hypertension medications:       Sig   bisoprolol-hydrochlorothiazide (ZIAC) 2.5-6.25 MG tablet TAKE 1 TABLET BY MOUTH ONCE DAILY   lisinopril (ZESTRIL) 5 MG tablet TAKE 1 TABLET BY MOUTH ONCE DAILY       BP is well controlled on the above medications.will continue as prescribed

## 2022-07-13 ENCOUNTER — Other Ambulatory Visit: Payer: Self-pay | Admitting: Acute Care

## 2022-07-13 DIAGNOSIS — Z122 Encounter for screening for malignant neoplasm of respiratory organs: Secondary | ICD-10-CM

## 2022-07-13 DIAGNOSIS — Z87891 Personal history of nicotine dependence: Secondary | ICD-10-CM

## 2022-08-04 ENCOUNTER — Ambulatory Visit (HOSPITAL_COMMUNITY)
Admission: RE | Admit: 2022-08-04 | Discharge: 2022-08-04 | Disposition: A | Discharge status: Home / Self Care | Payer: PPO | Source: Ambulatory Visit | Attending: Family Medicine | Admitting: Family Medicine

## 2022-08-04 ENCOUNTER — Encounter (HOSPITAL_COMMUNITY): Payer: Self-pay

## 2022-08-04 DIAGNOSIS — Z87891 Personal history of nicotine dependence: Secondary | ICD-10-CM | POA: Insufficient documentation

## 2022-08-04 DIAGNOSIS — Z122 Encounter for screening for malignant neoplasm of respiratory organs: Secondary | ICD-10-CM | POA: Insufficient documentation

## 2022-08-08 ENCOUNTER — Other Ambulatory Visit: Payer: Self-pay | Admitting: Acute Care

## 2022-08-08 DIAGNOSIS — Z87891 Personal history of nicotine dependence: Secondary | ICD-10-CM

## 2022-08-08 DIAGNOSIS — Z122 Encounter for screening for malignant neoplasm of respiratory organs: Secondary | ICD-10-CM

## 2022-09-01 ENCOUNTER — Encounter: Payer: Self-pay | Admitting: Family Medicine

## 2022-09-12 ENCOUNTER — Ambulatory Visit (INDEPENDENT_AMBULATORY_CARE_PROVIDER_SITE_OTHER): Payer: PPO

## 2022-09-12 VITALS — Ht 61.0 in | Wt 180.0 lb

## 2022-09-12 DIAGNOSIS — Z0000 Encounter for general adult medical examination without abnormal findings: Secondary | ICD-10-CM | POA: Diagnosis not present

## 2022-09-12 NOTE — Progress Notes (Addendum)
Subjective:   Marissa Shah is a 77 y.o. female who presents for Medicare Annual (Subsequent) preventive examination.  Review of Systems    Virtual Visit via Telephone Note  I connected with  Marissa Shah on 09/12/22 at  8:45 AM EDT by telephone and verified that I am speaking with the correct person using two identifiers.  Location: Patient: Home Provider: Office Persons participating in the virtual visit: patient/Nurse Health Advisor   I discussed the limitations, risks, security and privacy concerns of performing an evaluation and management service by telephone and the availability of in person appointments. The patient expressed understanding and agreed to proceed.  Interactive audio and video telecommunications were attempted between this nurse and patient, however failed, due to patient having technical difficulties OR patient did not have access to video capability.  We continued and completed visit with audio only.  Some vital signs may be absent or patient reported.   Tillie Rung, LPN  Cardiac Risk Factors include: advanced age (>37men, >4 women);hypertension     Objective:    Today's Vitals   09/12/22 0848  Weight: 180 lb (81.6 kg)  Height: 5\' 1"  (1.549 m)   Body mass index is 34.01 kg/m.     09/12/2022    8:57 AM 09/09/2021    8:55 AM 06/26/2020    9:50 AM  Advanced Directives  Does Patient Have a Medical Advance Directive? No No No  Would patient like information on creating a medical advance directive? No - Patient declined No - Patient declined No - Patient declined    Current Medications (verified) Outpatient Encounter Medications as of 09/12/2022  Medication Sig   albuterol (VENTOLIN HFA) 108 (90 Base) MCG/ACT inhaler Inhale 2 puffs into the lungs every 6 (six) hours as needed for wheezing or shortness of breath.   bisoprolol-hydrochlorothiazide (ZIAC) 2.5-6.25 MG tablet TAKE 1 TABLET BY MOUTH ONCE DAILY   Calcium Carbonate-Vitamin D  (CALTRATE 600+D PO) Take by mouth daily.   levothyroxine (EUTHYROX) 88 MCG tablet TAKE 1 TABLET BY MOUTH ONCE DAILY BEFORE BREAKFAST   lisinopril (ZESTRIL) 5 MG tablet TAKE 1 TABLET BY MOUTH ONCE DAILY   rosuvastatin (CRESTOR) 10 MG tablet TAKE 1 TABLET BY MOUTH ONCE DAILY   No facility-administered encounter medications on file as of 09/12/2022.    Allergies (verified) Elemental sulfur and Sulfamethoxazole   History: Past Medical History:  Diagnosis Date   Hypertension    Thyroid disease    Past Surgical History:  Procedure Laterality Date   ABDOMINAL HYSTERECTOMY     broken arm     left wrist/has screws and plates   Goiter  1968   removed   Family History  Problem Relation Age of Onset   Other Mother        died from flu   Other Father        TB, multiple health issues   Hearing loss Father    Hypertension Brother    Hearing loss Brother    Alcohol abuse Brother    Alcohol abuse Brother    Colon cancer Neg Hx    Social History   Socioeconomic History   Marital status: Married    Spouse name: Not on file   Number of children: Not on file   Years of education: Not on file   Highest education level: Not on file  Occupational History   Not on file  Tobacco Use   Smoking status: Former    Packs/day: 2.50  Years: 25.00    Additional pack years: 0.00    Total pack years: 62.50    Types: Cigarettes    Quit date: 08/13/2006    Years since quitting: 16.0   Smokeless tobacco: Never  Substance and Sexual Activity   Alcohol use: No    Alcohol/week: 0.0 standard drinks of alcohol   Drug use: No   Sexual activity: Not on file  Other Topics Concern   Not on file  Social History Narrative   Not on file   Social Determinants of Health   Financial Resource Strain: Low Risk  (09/12/2022)   Overall Financial Resource Strain (CARDIA)    Difficulty of Paying Living Expenses: Not hard at all  Food Insecurity: No Food Insecurity (09/12/2022)   Hunger Vital Sign     Worried About Running Out of Food in the Last Year: Never true    Ran Out of Food in the Last Year: Never true  Transportation Needs: No Transportation Needs (09/12/2022)   PRAPARE - Administrator, Civil Service (Medical): No    Lack of Transportation (Non-Medical): No  Physical Activity: Inactive (09/12/2022)   Exercise Vital Sign    Days of Exercise per Week: 0 days    Minutes of Exercise per Session: 0 min  Stress: No Stress Concern Present (09/12/2022)   [employer] of Occupational Health - Occupational Stress Questionnaire    Feeling of Stress : Not at all  Social Connections: Moderately Isolated (09/12/2022)   Social Connection and Isolation Panel [NHANES]    Frequency of Communication with Friends and Family: More than three times a week    Frequency of Social Gatherings with Friends and Family: More than three times a week    Attends Religious Services: Never    Database administrator or Organizations: No    Attends Engineer, structural: Never    Marital Status: Married    Tobacco Counseling Counseling given: Not Answered   Clinical Intake:  Pre-visit preparation completed: No  Pain : No/denies pain     BMI - recorded: 34.01 Nutritional Status: BMI > 30  Obese Nutritional Risks: None Diabetes: No  How often do you need to have someone help you when you read instructions, pamphlets, or other written materials from your doctor or pharmacy?: 1 - Never  Diabetic?  No  Interpreter Needed?: No  Information entered by :: Theresa Mulligan LPN   Activities of Daily Living    09/12/2022    8:54 AM  In your present state of health, do you have any difficulty performing the following activities:  Hearing? 1  Comment Wears hearing aids  Vision? 0  Difficulty concentrating or making decisions? 0  Walking or climbing stairs? 0  Dressing or bathing? 0  Doing errands, shopping? 0  Preparing Food and eating ? N  Using the Toilet? N  In the past  six months, have you accidently leaked urine? N  Do you have problems with loss of bowel control? N  Managing your Medications? N  Managing your Finances? N  Housekeeping or managing your Housekeeping? N    Patient Care Team: Karie Georges, MD as PCP - General (Family Medicine)  Indicate any recent Medical Services you may have received from other than [facility] providers in the past year (date may be approximate).     Assessment:   This is a routine wellness examination for Giovana.  Hearing/Vision screen Hearing Screening - Comments:: Wears hearing aids Vision Screening -  Comments:: Wears rx glasses - up to date with routine eye exams with  [facility]  Dietary issues and exercise activities discussed: Exercise limited by: respiratory conditions(s)   Goals Addressed               This Visit's Progress     Lose weight (pt-stated)         Depression Screen    09/12/2022    8:54 AM 07/04/2022    3:08 PM 09/09/2021    8:52 AM 06/28/2021    8:38 AM 06/26/2020    9:53 AM 01/11/2020    9:21 AM  PHQ 2/9 Scores  PHQ - 2 Score 0 0 0 0 0 0  PHQ- 9 Score  0  1 0     Fall Risk    09/12/2022    8:55 AM 07/04/2022    3:08 PM 09/09/2021    8:54 AM 06/28/2021    8:34 AM 06/26/2020    9:52 AM  Fall Risk   Falls in the past year? 0 0 0 0 0  Number falls in past yr: 0 0 0 0 0  Injury with Fall? 0 0 0 0 0  Risk for fall due to : No Fall Risks No Fall Risks No Fall Risks No Fall Risks No Fall Risks  Follow up Falls prevention discussed Falls evaluation completed  Falls evaluation completed Falls evaluation completed;Falls prevention discussed    FALL RISK PREVENTION PERTAINING TO THE HOME:  Any stairs in or around the home? No  If so, are there any without handrails? No  Home free of loose throw rugs in walkways, pet beds, electrical cords, etc? Yes  Adequate lighting in your home to reduce risk of falls? Yes   ASSISTIVE DEVICES UTILIZED TO PREVENT FALLS:  Life alert?  No  Use of a cane, walker or w/c? No  Grab bars in the bathroom? No  Shower chair or bench in shower? No  Elevated toilet seat or a handicapped toilet? No   TIMED UP AND GO:  Was the test performed? No . Audio visit  Cognitive Function:        09/12/2022    8:57 AM 09/09/2021    8:55 AM  6CIT Screen  What Year? 0 points 0 points  What month? 0 points 0 points  What time? 0 points 0 points  Count back from 20 0 points 0 points  Months in reverse 0 points 0 points  Repeat phrase 0 points 0 points  Total Score 0 points 0 points    Immunizations Immunization History  Administered Date(s) Administered   Fluad Quad(high Dose 65+) 01/11/2020   Influenza, High Dose Seasonal PF 01/29/2019   Influenza-Unspecified 01/30/2019, 12/29/2020   Moderna Sars-Covid-2 Vaccination 05/05/2019, 06/01/2019, 02/29/2020   Pneumococcal Polysaccharide-23 01/11/2020    TDAP status: Due, Education has been provided regarding the importance of this vaccine. Advised may receive this vaccine at local pharmacy or Health Dept. Aware to provide a copy of the vaccination record if obtained from local pharmacy or Health Dept. Verbalized acceptance and understanding.  Flu Vaccine status: Up to date  Pneumococcal vaccine status: Due, Education has been provided regarding the importance of this vaccine. Advised may receive this vaccine at local pharmacy or Health Dept. Aware to provide a copy of the vaccination record if obtained from local pharmacy or Health Dept. Verbalized acceptance and understanding.  Covid-19 vaccine status: Completed vaccines  Qualifies for Shingles Vaccine? Yes   Zostavax completed No  Shingrix Completed?: No.    Education has been provided regarding the importance of this vaccine. Patient has been advised to call insurance company to determine out of pocket expense if they have not yet received this vaccine. Advised may also receive vaccine at local pharmacy or Health Dept. Verbalized  acceptance and understanding.  Screening Tests Health Maintenance  Topic Date Due   DTaP/Tdap/Td (1 - Tdap) Never done   DEXA SCAN  Never done   Pneumonia Vaccine 57+ Years old (2 of 2 - PCV) 01/10/2021   Zoster Vaccines- Shingrix (1 of 2) 10/03/2022 (Originally 01/20/1996)   MAMMOGRAM  09/12/2023 (Originally 03/31/2018)   Colonoscopy  09/12/2023 (Originally 04/18/2020)   INFLUENZA VACCINE  10/30/2022   Medicare Annual Wellness (AWV)  09/12/2023   Hepatitis C Screening  Completed   HPV VACCINES  Aged Out   Lung Cancer Screening  Discontinued   COVID-19 Vaccine  Discontinued    Health Maintenance  Health Maintenance Due  Topic Date Due   DTaP/Tdap/Td (1 - Tdap) Never done   DEXA SCAN  Never done   Pneumonia Vaccine 69+ Years old (2 of 2 - PCV) 01/10/2021    Colorectal cancer screening: No longer required.   Mammogram status: No longer required due to Age.  Bone Density status: Ordered 07/04/22. Pt provided with contact info and advised to call to schedule appt.  Lung Cancer Screening: (Low Dose CT Chest recommended if Age 27-80 years, 30 pack-year currently smoking OR have quit w/in 15years.) does qualify.   Lung Cancer Screening Referral: Ordered on 08/08/22  Additional Screening:  Hepatitis C Screening: does qualify; Completed 01/11/20  Vision Screening: Recommended annual ophthalmology exams for early detection of glaucoma and other disorders of the eye. Is the patient up to date with their annual eye exam?  No  Who is the provider or what is the name of the office in which the patient attends annual eye exams? [facility] If pt is not established with a provider, would they like to be referred to a provider to establish care? No .   Dental Screening: Recommended annual dental exams for proper oral hygiene  Community Resource Referral / Chronic Care Management:  CRR required this visit?  No   CCM required this visit?  No      Plan:     I have personally  reviewed and noted the following in the patient's chart:   Medical and social history Use of alcohol, tobacco or illicit drugs  Current medications and supplements including opioid prescriptions. Patient is not currently taking opioid prescriptions. Functional ability and status Nutritional status Physical activity Advanced directives List of other physicians Hospitalizations, surgeries, and ER visits in previous 12 months Vitals Screenings to include cognitive, depression, and falls Referrals and appointments  In addition, I have reviewed and discussed with patient certain preventive protocols, quality metrics, and best practice recommendations. A written personalized care plan for preventive services as well as general preventive health recommendations were provided to patient.     Tillie Rung, LPN   1/61/0960   Nurse Notes:Patient request f/u with concerns of previous lab results and medical plan going forward.

## 2022-09-12 NOTE — Patient Instructions (Addendum)
Marissa Shah , Thank you for taking time to come for your Medicare Wellness Visit. I appreciate your ongoing commitment to your health goals. Please review the following plan we discussed and let me know if I can assist you in the future.   These are the goals we discussed:  Goals       Exercise 3x per week (30 min per time)      I would like to walk outside when the weather gets warm      Lose weight (pt-stated)      No current goals (pt-stated)      Weight (lb) < 150 lb (68 kg)        This is a list of the screening recommended for you and due dates:  Health Maintenance  Topic Date Due   DTaP/Tdap/Td vaccine (1 - Tdap) Never done   DEXA scan (bone density measurement)  Never done   Pneumonia Vaccine (2 of 2 - PCV) 01/10/2021   Zoster (Shingles) Vaccine (1 of 2) 10/03/2022*   Mammogram  09/12/2023*   Colon Cancer Screening  09/12/2023*   Flu Shot  10/30/2022   Medicare Annual Wellness Visit  09/12/2023   Hepatitis C Screening  Completed   HPV Vaccine  Aged Out   Screening for Lung Cancer  Discontinued   COVID-19 Vaccine  Discontinued  *Topic was postponed. The date shown is not the original due date.    Advanced directives: Advance directive discussed with you today. Even though you declined this today, please call our office should you change your mind, and we can give you the proper paperwork for you to fill out.   Conditions/risks identified: None  Next appointment: Follow up in one year for your annual wellness visit    Preventive Care 65 Years and Older, Female Preventive care refers to lifestyle choices and visits with your health care provider that can promote health and wellness. What does preventive care include? A yearly physical exam. This is also called an annual well check. Dental exams once or twice a year. Routine eye exams. Ask your health care provider how often you should have your eyes checked. Personal lifestyle choices, including: Daily care of your  teeth and gums. Regular physical activity. Eating a healthy diet. Avoiding tobacco and drug use. Limiting alcohol use. Practicing safe sex. Taking low-dose aspirin every day. Taking vitamin and mineral supplements as recommended by your health care provider. What happens during an annual well check? The services and screenings done by your health care provider during your annual well check will depend on your age, overall health, lifestyle risk factors, and family history of disease. Counseling  Your health care provider may ask you questions about your: Alcohol use. Tobacco use. Drug use. Emotional well-being. Home and relationship well-being. Sexual activity. Eating habits. History of falls. Memory and ability to understand (cognition). Work and work Astronomer. Reproductive health. Screening  You may have the following tests or measurements: Height, weight, and BMI. Blood pressure. Lipid and cholesterol levels. These may be checked every 5 years, or more frequently if you are over 4 years old. Skin check. Lung cancer screening. You may have this screening every year starting at age 77 if you have a 30-pack-year history of smoking and currently smoke or have quit within the past 15 years. Fecal occult blood test (FOBT) of the stool. You may have this test every year starting at age 54. Flexible sigmoidoscopy or colonoscopy. You may have a sigmoidoscopy every 5 years  or a colonoscopy every 10 years starting at age 33. Hepatitis C blood test. Hepatitis B blood test. Sexually transmitted disease (STD) testing. Diabetes screening. This is done by checking your blood sugar (glucose) after you have not eaten for a while (fasting). You may have this done every 1-3 years. Bone density scan. This is done to screen for osteoporosis. You may have this done starting at age 66. Mammogram. This may be done every 1-2 years. Talk to your health care provider about how often you should have  regular mammograms. Talk with your health care provider about your test results, treatment options, and if necessary, the need for more tests. Vaccines  Your health care provider may recommend certain vaccines, such as: Influenza vaccine. This is recommended every year. Tetanus, diphtheria, and acellular pertussis (Tdap, Td) vaccine. You may need a Td booster every 10 years. Zoster vaccine. You may need this after age 47. Pneumococcal 13-valent conjugate (PCV13) vaccine. One dose is recommended after age 56. Pneumococcal polysaccharide (PPSV23) vaccine. One dose is recommended after age 61. Talk to your health care provider about which screenings and vaccines you need and how often you need them. This information is not intended to replace advice given to you by your health care provider. Make sure you discuss any questions you have with your health care provider. Document Released: 04/13/2015 Document Revised: 12/05/2015 Document Reviewed: 01/16/2015 Elsevier Interactive Patient Education  2017 [place] Prevention in the Home Falls can cause injuries. They can happen to people of all ages. There are many things you can do to make your home safe and to help prevent falls. What can I do on the outside of my home? Regularly fix the edges of walkways and driveways and fix any cracks. Remove anything that might make you trip as you walk through a door, such as a raised step or threshold. Trim any bushes or trees on the path to your home. Use bright outdoor lighting. Clear any walking paths of anything that might make someone trip, such as rocks or tools. Regularly check to see if handrails are loose or broken. Make sure that both sides of any steps have handrails. Any raised decks and porches should have guardrails on the edges. Have any leaves, snow, or ice cleared regularly. Use sand or salt on walking paths during winter. Clean up any spills in your garage right away. This  includes oil or grease spills. What can I do in the bathroom? Use night lights. Install grab bars by the toilet and in the tub and shower. Do not use towel bars as grab bars. Use non-skid mats or decals in the tub or shower. If you need to sit down in the shower, use a plastic, non-slip stool. Keep the floor dry. Clean up any water that spills on the floor as soon as it happens. Remove soap buildup in the tub or shower regularly. Attach bath mats securely with double-sided non-slip rug tape. Do not have throw rugs and other things on the floor that can make you trip. What can I do in the bedroom? Use night lights. Make sure that you have a light by your bed that is easy to reach. Do not use any sheets or blankets that are too big for your bed. They should not hang down onto the floor. Have a firm chair that has side arms. You can use this for support while you get dressed. Do not have throw rugs and other things on the floor  that can make you trip. What can I do in the kitchen? Clean up any spills right away. Avoid walking on wet floors. Keep items that you use a lot in easy-to-reach places. If you need to reach something above you, use a strong step stool that has a grab bar. Keep electrical cords out of the way. Do not use floor polish or wax that makes floors slippery. If you must use wax, use non-skid floor wax. Do not have throw rugs and other things on the floor that can make you trip. What can I do with my stairs? Do not leave any items on the stairs. Make sure that there are handrails on both sides of the stairs and use them. Fix handrails that are broken or loose. Make sure that handrails are as long as the stairways. Check any carpeting to make sure that it is firmly attached to the stairs. Fix any carpet that is loose or worn. Avoid having throw rugs at the top or bottom of the stairs. If you do have throw rugs, attach them to the floor with carpet tape. Make sure that you  have a light switch at the top of the stairs and the bottom of the stairs. If you do not have them, ask someone to add them for you. What else can I do to help prevent falls? Wear shoes that: Do not have high heels. Have rubber bottoms. Are comfortable and fit you well. Are closed at the toe. Do not wear sandals. If you use a stepladder: Make sure that it is fully opened. Do not climb a closed stepladder. Make sure that both sides of the stepladder are locked into place. Ask someone to hold it for you, if possible. Clearly mark and make sure that you can see: Any grab bars or handrails. First and last steps. Where the edge of each step is. Use tools that help you move around (mobility aids) if they are needed. These include: Canes. Walkers. Scooters. Crutches. Turn on the lights when you go into a dark area. Replace any light bulbs as soon as they burn out. Set up your furniture so you have a clear path. Avoid moving your furniture around. If any of your floors are uneven, fix them. If there are any pets around you, be aware of where they are. Review your medicines with your doctor. Some medicines can make you feel dizzy. This can increase your chance of falling. Ask your doctor what other things that you can do to help prevent falls. This information is not intended to replace advice given to you by your health care provider. Make sure you discuss any questions you have with your health care provider. Document Released: 01/11/2009 Document Revised: 08/23/2015 Document Reviewed: 04/21/2014 Elsevier Interactive Patient Education  2017 [employer].

## 2022-10-08 ENCOUNTER — Encounter: Payer: Self-pay | Admitting: Family Medicine

## 2022-10-08 ENCOUNTER — Ambulatory Visit (INDEPENDENT_AMBULATORY_CARE_PROVIDER_SITE_OTHER): Payer: PPO | Admitting: Family Medicine

## 2022-10-08 VITALS — BP 120/62 | HR 70 | Temp 97.9°F | Ht 62.0 in | Wt 178.9 lb

## 2022-10-08 DIAGNOSIS — Z0000 Encounter for general adult medical examination without abnormal findings: Secondary | ICD-10-CM | POA: Diagnosis not present

## 2022-10-08 DIAGNOSIS — J449 Chronic obstructive pulmonary disease, unspecified: Secondary | ICD-10-CM | POA: Diagnosis not present

## 2022-10-08 MED ORDER — BREZTRI AEROSPHERE 160-9-4.8 MCG/ACT IN AERO
2.0000 | INHALATION_SPRAY | Freq: Two times a day (BID) | RESPIRATORY_TRACT | 11 refills | Status: DC
Start: 2022-10-08 — End: 2023-06-16

## 2022-10-08 NOTE — Progress Notes (Signed)
Complete physical exam  Patient: Marissa Shah   DOB: September 30, 1945   77 y.o. Female  MRN: [id number]  Subjective:    Chief Complaint  Patient presents with   Annual Exam    Marissa Shah is a 77 y.o. female who presents today for a complete physical exam. She reports consuming a general diet. Exercise is limited by respiratory condition(s): COPD. She generally feels well. She reports sleeping well. She does not have additional problems to discuss today.    Most recent fall risk assessment:    10/08/2022    9:12 AM  Fall Risk   Falls in the past year? 0  Number falls in past yr: 0  Injury with Fall? 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   bisoprolol-hydrochlorothiazide (ZIAC) 2.5-6.25 MG tablet TAKE 1 TABLET BY MOUTH ONCE DAILY   Calcium Carbonate-Vitamin D (CALTRATE 600+D PO) Take by mouth daily.   doxycycline (VIBRAMYCIN) 100 MG capsule Take 100 mg by mouth 2 (two) times daily. For sinus infection   levothyroxine (EUTHYROX) 88 MCG tablet TAKE 1 TABLET BY MOUTH ONCE DAILY BEFORE BREAKFAST   lisinopril (ZESTRIL) 5 MG tablet TAKE 1 TABLET BY MOUTH ONCE DAILY   predniSONE (DELTASONE) 20 MG tablet Take 20 mg by mouth daily with breakfast. For sinus infection    rosuvastatin (CRESTOR) 10 MG tablet TAKE 1 TABLET BY MOUTH ONCE DAILY   No facility-administered medications prior to visit.    Review of Systems  HENT:  Negative for hearing loss.   Eyes:  Negative for blurred vision.  Respiratory:  Negative for shortness of breath.   Cardiovascular:  Negative for chest pain.  Gastrointestinal: Negative.   Genitourinary: Negative.   Musculoskeletal:  Negative for back pain.  Neurological:  Negative for headaches.  Psychiatric/Behavioral:  Negative for depression.        Objective:     BP 120/62 (BP Location: Left Arm, Patient Position: Sitting, Cuff Size: Normal)   Pulse 70   Temp 97.9 F (36.6 C) (Oral)   Ht 5\' 2"  (1.575 m)   Wt 178 lb 14.4 oz (81.1 kg)   SpO2 94%   BMI 32.72 kg/m    Physical Exam Vitals reviewed.  Constitutional:      Appearance: Normal appearance. She is well-groomed and normal weight.  HENT:     Right Ear: Tympanic membrane normal.     Left Ear: Tympanic membrane normal.     Mouth/Throat:     Mouth: Mucous membranes are moist.     Pharynx: No posterior oropharyngeal erythema.  Eyes:     Conjunctiva/sclera: Conjunctivae normal.  Neck:     Thyroid:  No thyromegaly.  Cardiovascular:     Rate and Rhythm: Normal rate and regular rhythm.     Pulses: Normal pulses.     Heart sounds: S1 normal and S2 normal.  Pulmonary:     Effort: Pulmonary effort is normal.     Breath sounds: Normal breath sounds and air entry.  Abdominal:     General: Bowel sounds are normal.  Musculoskeletal:     Right lower leg: No edema.     Left lower leg: No edema.  Neurological:     Mental Status: She is alert and oriented to person, place, and time. Mental status is at baseline.     Gait: Gait is intact.  Psychiatric:        Mood and Affect: Mood and affect normal.        Speech: Speech normal.        Behavior: Behavior normal.        Judgment: Judgment normal.     No results found for any visits on 10/08/22.      Assessment & Plan:    Routine Health Maintenance and Physical Exam  Immunization History  Administered Date(s) Administered   Fluad Quad(high Dose 65+) 01/11/2020   Influenza, High Dose Seasonal PF 01/29/2019   Influenza-Unspecified 01/30/2019, 12/29/2020   Moderna Sars-Covid-2 Vaccination 05/05/2019, 06/01/2019, 02/29/2020   Pneumococcal Polysaccharide-23 01/11/2020    Health Maintenance  Topic Date Due   DTaP/Tdap/Td (1 - Tdap) Never done   Zoster Vaccines- Shingrix (1 of 2) Never done   DEXA SCAN  Never done   Pneumonia Vaccine 27+ Years old (2 of 2 - PCV) 01/10/2021   MAMMOGRAM  09/12/2023 (Originally 03/31/2018)   Colonoscopy  09/12/2023 (Originally 04/18/2020)   INFLUENZA VACCINE  10/30/2022   Medicare Annual Wellness (AWV)  09/12/2023   Hepatitis C Screening  Completed   HPV VACCINES  Aged Out   Lung Cancer Screening  Discontinued   COVID-19 Vaccine  Discontinued    Discussed health benefits of physical activity, and encouraged her to engage in regular exercise appropriate for her age and condition.  COPD, mild (HCC) -     Breztri Aerosphere; Inhale 2 puffs into the lungs 2 (two) times daily.  Dispense: 10.7 g; Refill: 11  Routine general medical examination at a health care facility  Normal physical exam findings today, lungs are clear on exam-- she is recovering from an acute COPD exacerbation currently. Will add Breztri inhalers 2 puffs BID to help with her SOB. If this is not effective then we will refer her to cardiology for a work up.   Return in 6 months (on 04/10/2023).     Karie Georges, MD

## 2022-10-08 NOTE — Patient Instructions (Addendum)
Call the office in 2 weeks -- let me know if the shortness of breath is better with the inhaler.   Health Maintenance, Female Adopting a healthy lifestyle and getting preventive care are important in promoting health and wellness. Ask your health care provider about: The right schedule for you to have regular tests and exams. Things you can do on your own to prevent diseases and keep yourself healthy. What should I know about diet, weight, and exercise? Eat a healthy diet  Eat a diet that includes plenty of vegetables, fruits, low-fat dairy products, and lean protein. Do not eat a lot of foods that are high in solid fats, added sugars, or sodium. Maintain a healthy weight Body mass index (BMI) is used to identify weight problems. It estimates body fat based on height and weight. Your health care provider can help determine your BMI and help you achieve or maintain a healthy weight. Get regular exercise Get regular exercise. This is one of the most important things you can do for your health. Most adults should: Exercise for at least 150 minutes each week. The exercise should increase your heart rate and make you sweat (moderate-intensity exercise). Do strengthening exercises at least twice a week. This is in addition to the moderate-intensity exercise. Spend less time sitting. Even light physical activity can be beneficial. Watch cholesterol and blood lipids Have your blood tested for lipids and cholesterol at 77 years of age, then have this test every 5 years. Have your cholesterol levels checked more often if: Your lipid or cholesterol levels are high. You are older than 77 years of age. You are at high risk for heart disease. What should I know about cancer screening? Depending on your health history and family history, you may need to have cancer screening at various ages. This may include screening for: Breast cancer. Cervical cancer. Colorectal cancer. Skin cancer. Lung  cancer. What should I know about heart disease, diabetes, and high blood pressure? Blood pressure and heart disease High blood pressure causes heart disease and increases the risk of stroke. This is more likely to develop in people who have high blood pressure readings or are overweight. Have your blood pressure checked: Every 3-5 years if you are 59-49 years of age. Every year if you are 9 years old or older. Diabetes Have regular diabetes screenings. This checks your fasting blood sugar level. Have the screening done: Once every three years after age 61 if you are at a normal weight and have a low risk for diabetes. More often and at a younger age if you are overweight or have a high risk for diabetes. What should I know about preventing infection? Hepatitis B If you have a higher risk for hepatitis B, you should be screened for this virus. Talk with your health care provider to find out if you are at risk for hepatitis B infection. Hepatitis C Testing is recommended for: Everyone born from [birth year] through 1965. Anyone with known risk factors for hepatitis C. Sexually transmitted infections (STIs) Get screened for STIs, including gonorrhea and chlamydia, if: You are sexually active and are younger than 77 years of age. You are older than 77 years of age and your health care provider tells you that you are at risk for this type of infection. Your sexual activity has changed since you were last screened, and you are at increased risk for chlamydia or gonorrhea. Ask your health care provider if you are at risk. Ask your health care provider  about whether you are at high risk for HIV. Your health care provider may recommend a prescription medicine to help prevent HIV infection. If you choose to take medicine to prevent HIV, you should first get tested for HIV. You should then be tested every 3 months for as long as you are taking the medicine. Pregnancy If you are about to stop having your  period (premenopausal) and you may become pregnant, seek counseling before you get pregnant. Take 400 to 800 micrograms (mcg) of folic acid every day if you become pregnant. Ask for birth control (contraception) if you want to prevent pregnancy. Osteoporosis and menopause Osteoporosis is a disease in which the bones lose minerals and strength with aging. This can result in bone fractures. If you are 18 years old or older, or if you are at risk for osteoporosis and fractures, ask your health care provider if you should: Be screened for bone loss. Take a calcium or vitamin D supplement to lower your risk of fractures. Be given hormone replacement therapy (HRT) to treat symptoms of menopause. Follow these instructions at home: Alcohol use Do not drink alcohol if: Your health care provider tells you not to drink. You are pregnant, may be pregnant, or are planning to become pregnant. If you drink alcohol: Limit how much you have to: 0-1 drink a day. Know how much alcohol is in your drink. In the [place] one drink equals one 12 oz bottle of beer (355 mL), one 5 oz glass of wine (148 mL), or one 1 oz glass of hard liquor (44 mL). Lifestyle Do not use any products that contain nicotine or tobacco. These products include cigarettes, chewing tobacco, and vaping devices, such as e-cigarettes. If you need help quitting, ask your health care provider. Do not use street drugs. Do not share needles. Ask your health care provider for help if you need support or information about quitting drugs. General instructions Schedule regular health, dental, and eye exams. Stay current with your vaccines. Tell your health care provider if: You often feel depressed. You have ever been abused or do not feel safe at home. Summary Adopting a healthy lifestyle and getting preventive care are important in promoting health and wellness. Follow your health care provider's instructions about healthy diet, exercising, and  getting tested or screened for diseases. Follow your health care provider's instructions on monitoring your cholesterol and blood pressure. This information is not intended to replace advice given to you by your health care provider. Make sure you discuss any questions you have with your health care provider. Document Revised: 08/06/2020 Document Reviewed: 08/06/2020 Elsevier Patient Education  2024 [employer].

## 2022-10-15 ENCOUNTER — Telehealth: Payer: Self-pay | Admitting: Family Medicine

## 2022-10-15 NOTE — Telephone Encounter (Signed)
 Patient informed of the message below.

## 2022-10-15 NOTE — Telephone Encounter (Signed)
FYI Pt is calling to let md know  Budeson-Glycopyrrol-Formoterol (BREZTRI AEROSPHERE) 160-9-4.8 MCG/ACT AERO  is helping a whole lot in controlling her symptoms. Pt was seen on 10-08-2022

## 2022-10-15 NOTE — Telephone Encounter (Signed)
That's great!  We will continue this medication for her. We can hold off on sending her to cardiology for now.

## 2023-04-02 ENCOUNTER — Telehealth: Payer: Self-pay | Admitting: Family Medicine

## 2023-04-02 ENCOUNTER — Ambulatory Visit: Payer: Self-pay | Admitting: Family Medicine

## 2023-04-02 DIAGNOSIS — U071 COVID-19: Secondary | ICD-10-CM | POA: Diagnosis not present

## 2023-04-02 NOTE — Telephone Encounter (Signed)
 Copied from CRM [id number]. Topic: Clinical - Red Word Triage >> Apr 02, 2023  8:18 AM Rolin D wrote: [facility] that prompted transfer to Nurse Triage: fever   Chief Complaint: Fever Symptoms: sore throat, sinus congestion Frequency: constant Pertinent Negatives: Patient denies recent exposure Disposition: [] ED /[x] Urgent Care (no appt availability in office) / [] Appointment(In office/virtual)/ []  [facility]/ [] Home Care/ [] Refused Recommended Disposition /[] [facility]/ []  Follow-up with PCP Additional Notes: spoke with  patients husband, pt having low-grade fever with sore throat and sinus congestion. Patient only taking Mucinex at this time. No avail appts until 01/06. Pt sts that she will go to Urgent Care.       Reason for Disposition  Patient sounds very sick or weak to the triager  (Exception: Mild weakness and hasn't taken fever medicine.)  Answer Assessment - Initial Assessment Questions 1. TEMPERATURE: What is the most recent temperature?  How was it measured?      99.9  2. ONSET: When did the fever start?      2-3 days ago   3. CHILLS: Do you have chills? If yes: How bad are they?  (e.g., none, mild, moderate, severe)   - NONE: no chills   - MILD: feeling cold   - MODERATE: feeling very cold, some shivering (feels better under a thick blanket)   - SEVERE: feeling extremely cold with shaking chills (general body shaking, rigors; even under a thick blanket)      No chills  4. OTHER SYMPTOMS: Do you have any other symptoms besides the fever?  (e.g., abdomen pain, cough, diarrhea, earache, headache, sore throat, urination pain)     Cough, sore throat, sinus congestion  5. CAUSE: If there are no symptoms, ask: What do you think is causing the fever?      Viral symptoms  6. CONTACTS: Does anyone else in the family have an infection?     No  7. TREATMENT: What have you done so far to treat this fever? (e.g., medications)      Mucines 8. IMMUNOCOMPROMISE: Do you have of the following: diabetes, HIV positive, splenectomy, cancer chemotherapy, chronic steroid treatment, transplant patient, etc.     No  9. PREGNANCY: Is there any chance you are pregnant? When was your last menstrual period?     N/A  10. TRAVEL: Have you traveled out of the country in the last month? (e.g., travel history, exposures)       No travel or exposure  Protocols used: [facility]

## 2023-04-02 NOTE — Telephone Encounter (Signed)
 Copied from CRM [id number]. Topic: Clinical - Red Word Triage >> Apr 02, 2023  8:16 AM Leavy Cella D wrote: [facility] that prompted transfer to Nurse Triage: fever

## 2023-04-14 ENCOUNTER — Ambulatory Visit (INDEPENDENT_AMBULATORY_CARE_PROVIDER_SITE_OTHER): Payer: PPO

## 2023-04-14 ENCOUNTER — Ambulatory Visit (INDEPENDENT_AMBULATORY_CARE_PROVIDER_SITE_OTHER): Payer: PPO | Admitting: Family Medicine

## 2023-04-14 ENCOUNTER — Encounter: Payer: Self-pay | Admitting: Family Medicine

## 2023-04-14 VITALS — BP 126/60 | HR 69 | Temp 98.3°F | Resp 16 | Ht 62.0 in | Wt 179.4 lb

## 2023-04-14 DIAGNOSIS — U071 COVID-19: Secondary | ICD-10-CM | POA: Diagnosis not present

## 2023-04-14 DIAGNOSIS — R051 Acute cough: Secondary | ICD-10-CM | POA: Diagnosis not present

## 2023-04-14 DIAGNOSIS — Z8616 Personal history of COVID-19: Secondary | ICD-10-CM | POA: Diagnosis not present

## 2023-04-14 DIAGNOSIS — R0989 Other specified symptoms and signs involving the circulatory and respiratory systems: Secondary | ICD-10-CM | POA: Diagnosis not present

## 2023-04-14 DIAGNOSIS — R059 Cough, unspecified: Secondary | ICD-10-CM | POA: Diagnosis not present

## 2023-04-14 DIAGNOSIS — J449 Chronic obstructive pulmonary disease, unspecified: Secondary | ICD-10-CM

## 2023-04-14 MED ORDER — ALBUTEROL SULFATE HFA 108 (90 BASE) MCG/ACT IN AERS: 2.0000 | INHALATION_SPRAY | Freq: Four times a day (QID) | RESPIRATORY_TRACT | 2 refills | Status: AC | PRN

## 2023-04-14 MED ORDER — HYDROCODONE BIT-HOMATROP MBR 5-1.5 MG/5ML PO SOLN: 5.0000 mL | Freq: Two times a day (BID) | ORAL | 0 refills | Status: AC | PRN

## 2023-04-14 MED ORDER — DOXYCYCLINE HYCLATE 100 MG PO TABS: 100.0000 mg | ORAL_TABLET | Freq: Two times a day (BID) | ORAL | 0 refills | Status: AC

## 2023-04-14 NOTE — Progress Notes (Signed)
 ACUTE VISIT Chief Complaint  Patient presents with   Follow-up    Cough ongoing x 3 weeks, worse at night time. Went to urgent care and did test positive for covid on 1/2. Was given tessalon pearles but they haven't helped.   HPI: Marissa Shah is a 78 y.o. female with a PMHx significant for HTN, COPD, hypothyroidism, Meniere disease, and HLD, who is here today for urgent care follow up.  Her PCP is not available today.  Patient complains of persistent cough starting on 12/31. She was seen in [facility] urgent care on 1/2 and was diagnosed with Covid 19 infection. She was given tessalon pearls 200 mg, but says they haven't helped at all.  She was not treated with antiviral medication.  Cough This is a new problem. The current episode started 1 to 4 weeks ago. The problem has been unchanged. The problem occurs every few hours. The cough is Productive of sputum. Associated symptoms include nasal congestion, postnasal drip and rhinorrhea. Pertinent negatives include no chest pain, chills, ear congestion, ear pain, fever, headaches, heartburn, hemoptysis, myalgias, rash, shortness of breath or wheezing. The symptoms are aggravated by lying down. She has tried prescription cough suppressant and OTC cough suppressant for the symptoms. The treatment provided no relief. Her past medical history is significant for COPD.   Currently, she is still having productive cough that is worse at night, greenish sputum. Cough causes sore throat. + Chest fullness but no wheezing or SOB.   COPD: He has used Albuterol  inh, she is on Breztri  160-9-4.8 2 puff bid.  Pertinent negatives include fever or chills.  Review of Systems  Constitutional:  Negative for chills and fever.  HENT:  Positive for postnasal drip and rhinorrhea. Negative for ear pain.   Respiratory:  Positive for cough. Negative for hemoptysis, shortness of breath and wheezing.   Cardiovascular:  Negative for chest pain.  Gastrointestinal:   Negative for abdominal pain, heartburn, nausea and vomiting.  Genitourinary:  Negative for decreased urine volume, dysuria and hematuria.  Musculoskeletal:  Negative for myalgias.  Skin:  Negative for rash.  Neurological:  Negative for syncope and headaches.  Psychiatric/Behavioral:  Negative for confusion and hallucinations.   See other pertinent positives and negatives in HPI.  Current Outpatient Medications on File Prior to Visit  Medication Sig Dispense Refill   bisoprolol -hydrochlorothiazide  (ZIAC ) 2.5-6.25 MG tablet TAKE 1 TABLET BY MOUTH ONCE DAILY 90 tablet 3   Budeson-Glycopyrrol-Formoterol (BREZTRI  AEROSPHERE) 160-9-4.8 MCG/ACT AERO Inhale 2 puffs into the lungs 2 (two) times daily. 10.7 g 11   Calcium  Carbonate-Vitamin D  (CALTRATE 600+D PO) Take by mouth daily.     levothyroxine  (EUTHYROX ) 88 MCG tablet TAKE 1 TABLET BY MOUTH ONCE DAILY BEFORE BREAKFAST 90 tablet 3   lisinopril  (ZESTRIL ) 5 MG tablet TAKE 1 TABLET BY MOUTH ONCE DAILY 90 tablet 3   rosuvastatin  (CRESTOR ) 10 MG tablet TAKE 1 TABLET BY MOUTH ONCE DAILY 90 tablet 3   No current facility-administered medications on file prior to visit.    Past Medical History:  Diagnosis Date   Hypertension    Thyroid  disease    Allergies  Allergen Reactions   Elemental Sulfur     Burning in throat!   Sulfamethoxazole Other (See Comments)    Patient feels like her mouth is on fire Patient feels like her mouth is on fire    Social History   Socioeconomic History   Marital status: Married    Spouse name: Not on file  Number of children: Not on file   Years of education: Not on file   Highest education level: Not on file  Occupational History   Not on file  Tobacco Use   Smoking status: Former    Current packs/day: 0.00    Average packs/day: 2.5 packs/day for 25.0 years (62.5 ttl pk-yrs)    Types: Cigarettes    Start date: 08/12/1981    Quit date: 08/13/2006    Years since quitting: 16.6   Smokeless tobacco: Never   Substance and Sexual Activity   Alcohol use: No    Alcohol/week: 0.0 standard drinks of alcohol   Drug use: No   Sexual activity: Not on file  Other Topics Concern   Not on file  Social History Narrative   Not on file   Social Drivers of Health   Financial Resource Strain: Low Risk  (09/12/2022)   Overall Financial Resource Strain (CARDIA)    Difficulty of Paying Living Expenses: Not hard at all  Food Insecurity: No Food Insecurity (09/12/2022)   Hunger Vital Sign    Worried About Running Out of Food in the Last Year: Never true    Ran Out of Food in the Last Year: Never true  Transportation Needs: No Transportation Needs (09/12/2022)   PRAPARE - Administrator, Civil Service (Medical): No    Lack of Transportation (Non-Medical): No  Physical Activity: Inactive (09/12/2022)   Exercise Vital Sign    Days of Exercise per Week: 0 days    Minutes of Exercise per Session: 0 min  Stress: No Stress Concern Present (09/12/2022)   [employer] of Occupational Health - Occupational Stress Questionnaire    Feeling of Stress : Not at all  Social Connections: Moderately Isolated (09/12/2022)   Social Connection and Isolation Panel [NHANES]    Frequency of Communication with Friends and Family: More than three times a week    Frequency of Social Gatherings with Friends and Family: More than three times a week    Attends Religious Services: Never    Database Administrator or Organizations: No    Attends Banker Meetings: Never    Marital Status: Married    Vitals:   04/14/23 1410  BP: 126/60  Pulse: 69  Resp: 16  Temp: 98.3 F (36.8 C)  SpO2: 92%   Body mass index is 32.81 kg/m.  Physical Exam Vitals and nursing note reviewed.  Constitutional:      General: She is not in acute distress.    Appearance: She is well-developed. She is not ill-appearing.  HENT:     Head: Normocephalic and atraumatic.     Nose: Congestion and rhinorrhea present.  Eyes:      Conjunctiva/sclera: Conjunctivae normal.  Cardiovascular:     Rate and Rhythm: Normal rate and regular rhythm.     Heart sounds: No murmur heard. Pulmonary:     Effort: Pulmonary effort is normal. No respiratory distress.     Breath sounds: No stridor. Rales (Fine rales, bilateral) present.     Comments: Productive cough a few times during her visit. Lymphadenopathy:     Cervical: No cervical adenopathy.  Skin:    General: Skin is warm.     Findings: No erythema or rash.  Neurological:     General: No focal deficit present.     Mental Status: She is alert and oriented to person, place, and time.     Gait: Gait normal.  Psychiatric:  Mood and Affect: Mood and affect normal.    ASSESSMENT AND PLAN:  Ms. Lemaster was seen today for urgent care follow up.   Bilateral rales Noted on examination today. No respiratory distress. COVID 19 infection 04/02/23. Doxycycline  100 mg bid x 7 d. Further recommendations according to CXR results. Instructed about warning signs.  -     DG Chest 2 View; Future       -     Doxycycline  Hyclate; Take 1 tablet (100 mg total) by mouth 2 (two) times daily for 7 days.  Dispense: 14 tablet; Refill: 0  COVID-19 virus infection Per pt report Dx'ed with COVID 19 infection Dx'ed 04/02/23, she was not treated with antiviral medication. Acute symptoms have improved. Explained that cough and congestion may last a few more days and even weeks after acute symptoms resolved.  Acute cough Benzonatate has not helped. Plain Mucinex recommended. Hycodan to take at bedtime   -     HYDROcodone  Bit-Homatrop MBr; Take 5 mLs by mouth every 12 (twelve) hours as needed for up to 10 days for cough.  Dispense: 80 mL; Refill: 0  COPD, mild (HCC) Continue Breztri  160-9-4.8 2 puff bid.  Albuterol  inh 2 puff every 6 hours for a week then as needed for wheezing or shortness of breath.  I do not think systemic steroid is needed.  -     Albuterol  Sulfate HFA; Inhale 2  puffs into the lungs every 6 (six) hours as needed for wheezing or shortness of breath.  Dispense: 18 g; Refill: 2  Return in about 2 weeks (around 04/28/2023) for with PCP.  I, Leonce PARAS Wierda, acting as a scribe for Garon Melander, MD., have documented all relevant documentation on the behalf of Grant Henkes, MD, as directed by  Dionicio Shelnutt, MD while in the presence of Alahna Dunne, MD.   I, Kindell Strada, MD, have reviewed all documentation for this visit. The documentation on 04/14/23 for the exam, diagnosis, procedures, and orders are all accurate and complete.  Hoa Deriso G. Abree Romick, MD  [facility]. [place] office.

## 2023-04-14 NOTE — Patient Instructions (Addendum)
 A few things to remember from today's visit:  Bilateral rales - Plan: DG Chest 2 View  COVID-19 virus infection  Acute cough - Plan: HYDROcodone  bit-homatropine (HYCODAN) 5-1.5 MG/5ML syrup  COPD, mild (HCC) - Plan: albuterol  (VENTOLIN  HFA) 108 (90 Base) MCG/ACT inhaler Albuterol  inh 2 puff every 6 hours for a week then as needed for wheezing or shortness of breath.  Doxycycline  with food.  If you need refills for medications you take chronically, please call your pharmacy. Do not use My Chart to request refills or for acute issues that need immediate attention. If you send a my chart message, it may take a few days to be addressed, specially if I am not in the office.  Please be sure medication list is accurate. If a new problem present, please set up appointment sooner than planned today.

## 2023-06-16 ENCOUNTER — Encounter: Payer: Self-pay | Admitting: Family Medicine

## 2023-06-16 ENCOUNTER — Ambulatory Visit (INDEPENDENT_AMBULATORY_CARE_PROVIDER_SITE_OTHER): Admitting: Family Medicine

## 2023-06-16 VITALS — BP 118/67 | HR 70 | Temp 98.4°F | Ht 62.0 in | Wt 182.9 lb

## 2023-06-16 DIAGNOSIS — Z780 Asymptomatic menopausal state: Secondary | ICD-10-CM

## 2023-06-16 DIAGNOSIS — E039 Hypothyroidism, unspecified: Secondary | ICD-10-CM | POA: Diagnosis not present

## 2023-06-16 DIAGNOSIS — E785 Hyperlipidemia, unspecified: Secondary | ICD-10-CM | POA: Diagnosis not present

## 2023-06-16 DIAGNOSIS — J449 Chronic obstructive pulmonary disease, unspecified: Secondary | ICD-10-CM | POA: Diagnosis not present

## 2023-06-16 DIAGNOSIS — I10 Essential (primary) hypertension: Secondary | ICD-10-CM | POA: Diagnosis not present

## 2023-06-16 MED ORDER — BISOPROLOL-HYDROCHLOROTHIAZIDE 2.5-6.25 MG PO TABS: ORAL_TABLET | ORAL | 3 refills | Status: AC

## 2023-06-16 MED ORDER — LISINOPRIL 5 MG PO TABS: ORAL_TABLET | ORAL | 3 refills | Status: AC

## 2023-06-16 MED ORDER — LEVOTHYROXINE SODIUM 88 MCG PO TABS: ORAL_TABLET | ORAL | 3 refills | Status: AC

## 2023-06-16 MED ORDER — BREZTRI AEROSPHERE 160-9-4.8 MCG/ACT IN AERO: 2.0000 | INHALATION_SPRAY | Freq: Two times a day (BID) | RESPIRATORY_TRACT | 11 refills | Status: AC

## 2023-06-16 MED ORDER — ROSUVASTATIN CALCIUM 10 MG PO TABS: ORAL_TABLET | ORAL | 3 refills | Status: AC

## 2023-06-16 NOTE — Assessment & Plan Note (Signed)
 Current hypertension medications:       Sig   bisoprolol-hydrochlorothiazide (ZIAC) 2.5-6.25 MG tablet TAKE 1 TABLET BY MOUTH ONCE DAILY   lisinopril (ZESTRIL) 5 MG tablet TAKE 1 TABLET BY MOUTH ONCE DAILY       Chronic, stable. BP is well controlled on the above medications.will continue as prescribed

## 2023-06-16 NOTE — Assessment & Plan Note (Signed)
 No wheezing on exam, she continues to report chronic dyspnea with exertion which is chronic/ unchanged from previous, she attributes this to her weight mostly and expresses a desire to lose weight. She cannot participate in moderate intensity exercise due to the lung condition. Continue inhalers listed below

## 2023-06-16 NOTE — Assessment & Plan Note (Signed)
 Chronic, stable. Reviewed last labs. On 88 mcg of levothyroxine, pt reports 3 pound weight gain since last visit, inability to lose weight. Counseled patient on this, will repeat bloodwork at the next visit. Last TSH was WNL

## 2023-06-16 NOTE — Progress Notes (Unsigned)
 Established Patient Office Visit  Subjective   Patient ID: Marissa Shah, female    DOB: 1946-02-20  Age: 78 y.o. MRN: [id number]  Chief Complaint  Patient presents with  . Medical Management of Chronic Issues    Pt is here for 6 month follow up today. She reports that she had COVID over the winter and was very ill but she recovered well. Breathing today is at her baseline functioning. States that she only has trouble with inclines/ going uphill. Pt takes cetirizine at home for her allergy symptoms in the spring. No new symptoms or issues to report.    Current Outpatient Medications  Medication Instructions  . albuterol (VENTOLIN HFA) 108 (90 Base) MCG/ACT inhaler 2 puffs, Inhalation, Every 6 hours PRN  . bisoprolol-hydrochlorothiazide (ZIAC) 2.5-6.25 MG tablet TAKE 1 TABLET BY MOUTH ONCE DAILY  . budeson-glycopyrrolate-formoterol (BREZTRI AEROSPHERE) 160-9-4.8 MCG/ACT AERO 2 puffs, Inhalation, 2 times daily  . Calcium Carbonate-Vitamin D (CALTRATE 600+D PO) Daily  . levothyroxine (EUTHYROX) 88 MCG tablet TAKE 1 TABLET BY MOUTH ONCE DAILY BEFORE BREAKFAST  . lisinopril (ZESTRIL) 5 MG tablet TAKE 1 TABLET BY MOUTH ONCE DAILY  . rosuvastatin (CRESTOR) 10 MG tablet TAKE 1 TABLET BY MOUTH ONCE DAILY    Patient Active Problem List   Diagnosis Date Noted  . COPD, mild (HCC) 07/04/2022  . Essential hypertension 05/15/2013  . Hyperlipidemia 05/15/2013  . Dyspnea 05/15/2013  . Hypothyroid 05/15/2013  . Meniere disease 05/15/2013      Review of Systems  All other systems reviewed and are negative.     Objective:     BP 118/67   Pulse 70   Temp 98.4 F (36.9 C) (Oral)   Ht 5\' 2"  (1.575 m)   Wt 182 lb 14.4 oz (83 kg)   SpO2 95%   BMI 33.45 kg/m  {Vitals History (Optional):23777}  Physical Exam Vitals reviewed.  Constitutional:      Appearance: Normal appearance. She is well-groomed and normal weight.  Neck:     Thyroid: No thyromegaly.  Cardiovascular:      Rate and Rhythm: Normal rate and regular rhythm.     Heart sounds: S1 normal and S2 normal.  Pulmonary:     Effort: Pulmonary effort is normal.     Breath sounds: Normal breath sounds and air entry.  Musculoskeletal:     Right lower leg: No edema.     Left lower leg: No edema.  Neurological:     Mental Status: She is alert and oriented to person, place, and time. Mental status is at baseline.     Gait: Gait is intact.  Psychiatric:        Mood and Affect: Mood and affect normal.        Speech: Speech normal.        Behavior: Behavior normal.        Judgment: Judgment normal.     No results found for any visits on 06/16/23.  {Labs (Optional):23779}  The 10-year ASCVD risk score (Arnett DK, et al., 2019) is: 21.7%    Assessment & Plan:  Postmenopausal state -     DG Bone Density; Future  Essential hypertension Assessment & Plan: Current hypertension medications:       Sig   bisoprolol-hydrochlorothiazide (ZIAC) 2.5-6.25 MG tablet TAKE 1 TABLET BY MOUTH ONCE DAILY   lisinopril (ZESTRIL) 5 MG tablet TAKE 1 TABLET BY MOUTH ONCE DAILY       Chronic, stable. BP is well controlled on the  above medications.will continue as prescribed  Orders: -     Bisoprolol-hydroCHLOROthiazide; TAKE 1 TABLET BY MOUTH ONCE DAILY  Dispense: 90 tablet; Refill: 3 -     Lisinopril; TAKE 1 TABLET BY MOUTH ONCE DAILY  Dispense: 90 tablet; Refill: 3  COPD, mild (HCC) Assessment & Plan: No wheezing on exam, she continues to report chronic dyspnea with exertion which is chronic/ unchanged from previous, she attributes this to her weight mostly and expresses a desire to lose weight. She cannot participate in moderate intensity exercise due to the lung condition. Continue inhalers listed below  Orders: -     Breztri Aerosphere; Inhale 2 puffs into the lungs 2 (two) times daily.  Dispense: 10.7 g; Refill: 11  Hypothyroidism, unspecified type Assessment & Plan: Chronic, stable. Reviewed last labs.  On 88 mcg of levothyroxine, pt reports 3 pound weight gain since last visit, inability to lose weight. Counseled patient on this, will repeat bloodwork at the next visit. Last TSH was WNL  Orders: -     Levothyroxine Sodium; TAKE 1 TABLET BY MOUTH ONCE DAILY BEFORE BREAKFAST  Dispense: 90 tablet; Refill: 3  Hyperlipidemia, unspecified hyperlipidemia type -     Rosuvastatin Calcium; TAKE 1 TABLET BY MOUTH ONCE DAILY  Dispense: 90 tablet; Refill: 3     Return in about 4 months (around 10/16/2023) for annual physical exam -- fasting for blood work.    Karie Georges, MD

## 2023-09-02 ENCOUNTER — Other Ambulatory Visit: Payer: Self-pay | Admitting: Acute Care

## 2023-09-02 DIAGNOSIS — Z122 Encounter for screening for malignant neoplasm of respiratory organs: Secondary | ICD-10-CM

## 2023-09-02 DIAGNOSIS — Z87891 Personal history of nicotine dependence: Secondary | ICD-10-CM

## 2023-09-16 ENCOUNTER — Ambulatory Visit (INDEPENDENT_AMBULATORY_CARE_PROVIDER_SITE_OTHER): Payer: PPO

## 2023-09-16 VITALS — Ht 62.0 in | Wt 182.0 lb

## 2023-09-16 DIAGNOSIS — Z13820 Encounter for screening for osteoporosis: Secondary | ICD-10-CM | POA: Diagnosis not present

## 2023-09-16 DIAGNOSIS — Z0000 Encounter for general adult medical examination without abnormal findings: Secondary | ICD-10-CM

## 2023-09-16 NOTE — Patient Instructions (Addendum)
 Ms. Fiorenza , Thank you for taking time out of your busy schedule to complete your Annual Wellness Visit with me. I enjoyed our conversation and look forward to speaking with you again next year. I, as well as your care team,  appreciate your ongoing commitment to your health goals. Please review the following plan we discussed and let me know if I can assist you in the future. Your Game plan/ To Do List    Referrals: If you haven't heard from the office you've been referred to, please reach out to them at the phone provided.   Follow up Visits: Next Medicare AWV with our clinical staff: 09/21/24 @ 8:40a   Have you seen your provider in the last 6 months (3 months if uncontrolled diabetes)?  Next Office Visit with your provider: 10/09/23 @ 9a  Clinician Recommendations:  Aim for 30 minutes of exercise or brisk walking, 6-8 glasses of water, and 5 servings of fruits and vegetables each day.       This is a list of the screening recommended for you and due dates:  Health Maintenance  Topic Date Due   DTaP/Tdap/Td vaccine (1 - Tdap) Never done   Zoster (Shingles) Vaccine (1 of 2) Never done   DEXA scan (bone density measurement)  Never done   Mammogram  03/31/2018   Colon Cancer Screening  04/18/2020   Pneumococcal Vaccine for age over 23 (2 of 2 - PCV) 01/10/2021   Flu Shot  10/30/2023   Medicare Annual Wellness Visit  09/15/2024   Hepatitis C Screening  Completed   HPV Vaccine  Aged Out   Meningitis B Vaccine  Aged Out   Screening for Lung Cancer  Discontinued   COVID-19 Vaccine  Discontinued    Advanced directives: (Declined) Advance directive discussed with you today. Even though you declined this today, please call our office should you change your mind, and we can give you the proper paperwork for you to fill out. Advance Care Planning is important because it:  [x]  Makes sure you receive the medical care that is consistent with your values, goals, and preferences  [x]  It provides  guidance to your family and loved ones and reduces their decisional burden about whether or not they are making the right decisions based on your wishes.  Follow the link provided in your after visit summary or read over the paperwork we have mailed to you to help you started getting your Advance Directives in place. If you need assistance in completing these, please reach out to us  so that we can help you!  See attachments for Preventive Care and Fall Prevention Tips.

## 2023-09-16 NOTE — Progress Notes (Cosign Needed)
 Subjective:   Marissa Shah is a 77 y.o. who presents for a Medicare Wellness preventive visit.  As a reminder, Annual Wellness Visits don't include a physical exam, and some assessments may be limited, especially if this visit is performed virtually. We may recommend an in-person follow-up visit with your provider if needed.  Visit Complete: Virtual I connected with  Marissa Shah on 09/16/23 by a audio enabled telemedicine application and verified that I am speaking with the correct person using two identifiers.  Patient Location: Home  Provider Location: Home Office  I discussed the limitations of evaluation and management by telemedicine. The patient expressed understanding and agreed to proceed.  Vital Signs: Because this visit was a virtual/telehealth visit, some criteria may be missing or patient reported. Any vitals not documented were not able to be obtained and vitals that have been documented are patient reported.    Persons Participating in Visit: Patient.  AWV Questionnaire: No: Patient Medicare AWV questionnaire was not completed prior to this visit.  Cardiac Risk Factors include: advanced age (>30men, >37 women);hypertension     Objective:    Today's Vitals   09/16/23 0842  Weight: 182 lb (82.6 kg)  Height: 5' 2 (1.575 m)   Body mass index is 33.29 kg/m.     09/16/2023    8:48 AM 09/12/2022    8:57 AM 09/09/2021    8:55 AM 06/26/2020    9:50 AM  Advanced Directives  Does Patient Have a Medical Advance Directive? No No No No  Would patient like information on creating a medical advance directive? No - Patient declined No - Patient declined No - Patient declined No - Patient declined    Current Medications (verified) Outpatient Encounter Medications as of 09/16/2023  Medication Sig   albuterol  (VENTOLIN  HFA) 108 (90 Base) MCG/ACT inhaler Inhale 2 puffs into the lungs every 6 (six) hours as needed for wheezing or shortness of breath.    bisoprolol -hydrochlorothiazide  (ZIAC ) 2.5-6.25 MG tablet TAKE 1 TABLET BY MOUTH ONCE DAILY   budeson-glycopyrrolate-formoterol (BREZTRI  AEROSPHERE) 160-9-4.8 MCG/ACT AERO Inhale 2 puffs into the lungs 2 (two) times daily.   Calcium  Carbonate-Vitamin D  (CALTRATE 600+D PO) Take by mouth daily.   levothyroxine  (EUTHYROX ) 88 MCG tablet TAKE 1 TABLET BY MOUTH ONCE DAILY BEFORE BREAKFAST   lisinopril  (ZESTRIL ) 5 MG tablet TAKE 1 TABLET BY MOUTH ONCE DAILY   rosuvastatin  (CRESTOR ) 10 MG tablet TAKE 1 TABLET BY MOUTH ONCE DAILY   No facility-administered encounter medications on file as of 09/16/2023.    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   Family History  Problem Relation Age of Onset   Other Mother        died from flu   Other Father        TB, multiple health issues   Hearing loss Father    Hypertension Brother    Hearing loss Brother    Alcohol abuse Brother    Alcohol abuse Brother    Colon cancer Neg Hx    Social History   Socioeconomic History   Marital status: Married    Spouse name: Not on file   Number of children: Not on file  Years of education: Not on file   Highest education level: Not on file  Occupational History   Not on file  Tobacco Use   Smoking status: Former    Current packs/day: 0.00    Average packs/day: 2.5 packs/day for 25.0 years (62.5 ttl pk-yrs)    Types: Cigarettes    Start date: 08/12/1981    Quit date: 08/13/2006    Years since quitting: 17.1   Smokeless tobacco: Never  Substance and Sexual Activity   Alcohol use: No    Alcohol/week: 0.0 standard drinks of alcohol   Drug use: No   Sexual activity: Not on file  Other Topics Concern   Not on file  Social History Narrative    Not on file   Social Drivers of Health   Financial Resource Strain: Low Risk  (09/16/2023)   Overall Financial Resource Strain (CARDIA)    Difficulty of Paying Living Expenses: Not hard at all  Food Insecurity: No Food Insecurity (09/16/2023)   Hunger Vital Sign    Worried About Running Out of Food in the Last Year: Never true    Ran Out of Food in the Last Year: Never true  Transportation Needs: No Transportation Needs (09/16/2023)   PRAPARE - Administrator, Civil Service (Medical): No    Lack of Transportation (Non-Medical): No  Physical Activity: Inactive (09/16/2023)   Exercise Vital Sign    Days of Exercise per Week: 0 days    Minutes of Exercise per Session: 0 min  Stress: No Stress Concern Present (09/16/2023)   [employer] of Occupational Health - Occupational Stress Questionnaire    Feeling of Stress: Not at all  Social Connections: Socially Integrated (09/16/2023)   Social Connection and Isolation Panel    Frequency of Communication with Friends and Family: More than three times a week    Frequency of Social Gatherings with Friends and Family: More than three times a week    Attends Religious Services: More than 4 times per year    Active Member of [employer] or Organizations: Yes    Attends Engineer, structural: More than 4 times per year    Marital Status: Married    Tobacco Counseling Counseling given: Not Answered    Clinical Intake:  Pre-visit preparation completed: Yes  Pain : No/denies pain     BMI - recorded: 33.29 Nutritional Status: BMI > 30  Obese Nutritional Risks: None Diabetes: No  Lab Results  Component Value Date   HGBA1C 5.9 05/03/2019     How often do you need to have someone help you when you read instructions, pamphlets, or other written materials from your doctor or pharmacy?: 1 - Never  Interpreter Needed?: No  Information entered by :: Farris Hong   Activities of Daily Living     09/16/2023    8:47 AM   In your present state of health, do you have any difficulty performing the following activities:  Hearing? 1  Comment Wears Hearing Aids  Vision? 0  Difficulty concentrating or making decisions? 0  Walking or climbing stairs? 0  Dressing or bathing? 0  Doing errands, shopping? 0  Preparing Food and eating ? N  Using the Toilet? N  In the past six months, have you accidently leaked urine? N  Do you have problems with loss of bowel control? N  Managing your Medications? N  Managing your Finances? N  Housekeeping or managing your Housekeeping? N    Patient Care Team: Osborn Blaze  M, MD as PCP - General (Family Medicine)  I have updated your Care Teams any recent Medical Services you may have received from other providers in the past year.     Assessment:   This is a routine wellness examination for Marissa Shah.  Hearing/Vision screen Hearing Screening - Comments:: Wears Hearing Aids Vision Screening - Comments:: Wears rx glasses - up to date with routine eye exams  Pending appt with Dr Joanne Muckle   Goals Addressed               This Visit's Progress     Increase physical activity (pt-stated)        Lose weight       Depression Screen     09/16/2023    8:46 AM 10/08/2022    9:12 AM 09/12/2022    8:54 AM 07/04/2022    3:08 PM 09/09/2021    8:52 AM 06/28/2021    8:38 AM 06/26/2020    9:53 AM  PHQ 2/9 Scores  PHQ - 2 Score 0 0 0 0 0 0 0  PHQ- 9 Score    0  1 0    Fall Risk     09/16/2023    8:47 AM 10/08/2022    9:12 AM 09/12/2022    8:55 AM 07/04/2022    3:08 PM 09/09/2021    8:54 AM  Fall Risk   Falls in the past year? 0 0 0 0 0  Number falls in past yr: 0 0 0 0 0  Injury with Fall? 0 0 0 0 0  Risk for fall due to : No Fall Risks Other (Comment) No Fall Risks No Fall Risks No Fall Risks  Follow up Falls evaluation completed Falls evaluation completed Falls prevention discussed Falls evaluation completed     MEDICARE RISK AT HOME:  Medicare Risk at Home Any  stairs in or around the home?: No If so, are there any without handrails?: No Home free of loose throw rugs in walkways, pet beds, electrical cords, etc?: Yes Adequate lighting in your home to reduce risk of falls?: Yes Life alert?: No Use of a cane, walker or w/c?: No Grab bars in the bathroom?: Yes Shower chair or bench in shower?: Yes Elevated toilet seat or a handicapped toilet?: No  TIMED UP AND GO:  Was the test performed?  No  Cognitive Function: 6CIT completed        09/16/2023    8:48 AM 09/12/2022    8:57 AM 09/09/2021    8:55 AM  6CIT Screen  What Year? 0 points 0 points 0 points  What month? 0 points 0 points 0 points  What time? 0 points 0 points 0 points  Count back from 20 0 points 0 points 0 points  Months in reverse 0 points 0 points 0 points  Repeat phrase 0 points 0 points 0 points  Total Score 0 points 0 points 0 points    Immunizations Immunization History  Administered Date(s) Administered   Fluad Quad(high Dose 65+) 01/11/2020   Influenza, High Dose Seasonal PF 01/29/2019   Influenza-Unspecified 01/30/2019, 12/29/2020   Moderna Sars-Covid-2 Vaccination 05/05/2019, 06/01/2019, 02/29/2020   Pneumococcal Polysaccharide-23 01/11/2020    Screening Tests Health Maintenance  Topic Date Due   DTaP/Tdap/Td (1 - Tdap) Never done   Zoster Vaccines- Shingrix  (1 of 2) Never done   DEXA SCAN  Never done   MAMMOGRAM  03/31/2018   Colonoscopy  04/18/2020   Pneumococcal Vaccine: 50+ Years (2 of  2 - PCV) 01/10/2021   INFLUENZA VACCINE  10/30/2023   Medicare Annual Wellness (AWV)  09/15/2024   Hepatitis C Screening  Completed   HPV VACCINES  Aged Out   Meningococcal B Vaccine  Aged Out   Lung Cancer Screening  Discontinued   COVID-19 Vaccine  Discontinued    Health Maintenance  Health Maintenance Due  Topic Date Due   DTaP/Tdap/Td (1 - Tdap) Never done   Zoster Vaccines- Shingrix  (1 of 2) Never done   DEXA SCAN  Never done   MAMMOGRAM  03/31/2018    Colonoscopy  04/18/2020   Pneumococcal Vaccine: 50+ Years (2 of 2 - PCV) 01/10/2021   Health Maintenance Items Addressed: DEXA ordered  Additional Screening:  Vision Screening: Recommended annual ophthalmology exams for early detection of glaucoma and other disorders of the eye. Would you like a referral to an eye doctor? No    Dental Screening: Recommended annual dental exams for proper oral hygiene  Community Resource Referral / Chronic Care Management: CRR required this visit?  No   CCM required this visit?  No   Plan:    I have personally reviewed and noted the following in the patient's chart:   Medical and social history Use of alcohol, tobacco or illicit drugs  Current medications and supplements including opioid prescriptions. Patient is not currently taking opioid prescriptions. Functional ability and status Nutritional status Physical activity Advanced directives List of other physicians Hospitalizations, surgeries, and ER visits in previous 12 months Vitals Screenings to include cognitive, depression, and falls Referrals and appointments  In addition, I have reviewed and discussed with patient certain preventive protocols, quality metrics, and best practice recommendations. A written personalized care plan for preventive services as well as general preventive health recommendations were provided to patient.   Dewayne Ford, LPN   [id number]   After Visit Summary: (MyChart) Due to this being a telephonic visit, the after visit summary with patients personalized plan was offered to patient via MyChart   Notes: Nothing significant to report at this time.

## 2023-09-23 ENCOUNTER — Ambulatory Visit (HOSPITAL_COMMUNITY)

## 2023-10-09 ENCOUNTER — Encounter: Admitting: Family Medicine

## 2023-10-16 ENCOUNTER — Encounter: Payer: Self-pay | Admitting: Family Medicine

## 2023-10-16 ENCOUNTER — Ambulatory Visit (INDEPENDENT_AMBULATORY_CARE_PROVIDER_SITE_OTHER): Admitting: Family Medicine

## 2023-10-16 ENCOUNTER — Ambulatory Visit: Payer: Self-pay | Admitting: Family Medicine

## 2023-10-16 VITALS — BP 112/68 | HR 55 | Temp 98.0°F | Ht 61.75 in | Wt 187.2 lb

## 2023-10-16 DIAGNOSIS — I10 Essential (primary) hypertension: Secondary | ICD-10-CM | POA: Diagnosis not present

## 2023-10-16 DIAGNOSIS — Z0000 Encounter for general adult medical examination without abnormal findings: Secondary | ICD-10-CM

## 2023-10-16 DIAGNOSIS — E039 Hypothyroidism, unspecified: Secondary | ICD-10-CM | POA: Diagnosis not present

## 2023-10-16 DIAGNOSIS — Z131 Encounter for screening for diabetes mellitus: Secondary | ICD-10-CM

## 2023-10-16 DIAGNOSIS — E785 Hyperlipidemia, unspecified: Secondary | ICD-10-CM | POA: Diagnosis not present

## 2023-10-16 DIAGNOSIS — Z780 Asymptomatic menopausal state: Secondary | ICD-10-CM

## 2023-10-16 LAB — TSH: TSH: 0.85 u[IU]/mL (ref 0.35–5.50)

## 2023-10-16 LAB — COMPREHENSIVE METABOLIC PANEL WITH GFR
ALT: 15 U/L (ref 0–35)
AST: 18 U/L (ref 0–37)
Albumin: 4.4 g/dL (ref 3.5–5.2)
Alkaline Phosphatase: 77 U/L (ref 39–117)
BUN: 15 mg/dL (ref 6–23)
CO2: 31 meq/L (ref 19–32)
Calcium: 9.1 mg/dL (ref 8.4–10.5)
Chloride: 100 meq/L (ref 96–112)
Creatinine, Ser: 0.76 mg/dL (ref 0.40–1.20)
GFR: 75.41 mL/min (ref >60.00)
Glucose, Bld: 101 mg/dL — ABNORMAL HIGH (ref 70–99)
Potassium: 5.5 meq/L — ABNORMAL HIGH (ref 3.5–5.1)
Sodium: 138 meq/L (ref 135–145)
Total Bilirubin: 0.3 mg/dL (ref 0.2–1.2)
Total Protein: 7.8 g/dL (ref 6.0–8.3)

## 2023-10-16 LAB — HEMOGLOBIN A1C: Hgb A1c MFr Bld: 6.2 % (ref 4.6–6.5)

## 2023-10-16 LAB — LIPID PANEL
Cholesterol: 160 mg/dL (ref 0–200)
HDL: 43.4 mg/dL (ref >39.00)
LDL Cholesterol: 93 mg/dL (ref 0–99)
NonHDL: 116.74
Total CHOL/HDL Ratio: 4
Triglycerides: 120 mg/dL (ref 0.0–149.0)
VLDL: 24 mg/dL (ref 0.0–40.0)

## 2023-10-16 NOTE — Progress Notes (Signed)
 Complete physical exam  Patient: Marissa Shah   DOB: 07/20/1945   78 y.o. Female  MRN: [id number]  Subjective:    Chief Complaint  Patient presents with   Annual Exam    Marissa Shah is a 78 y.o. female who presents today for a complete physical exam. She reports consuming a general diet, eats a lot of fruit and veggies, good sources of protein, fish, chicken and eggs mostly. No vitamins or supplements The patient does not participate in regular exercise at present. Tried to get up and stay active, works in the garden a lot. She generally feels well. She reports sleeping well, gets about 6-7 hours, feels rested in the morning. She does not have additional problems to discuss today.    Most recent fall risk assessment:    10/16/2023    9:14 AM  Fall Risk   Falls in the past year? 0  Number falls in past yr: 0  Injury with Fall? 0  Risk for fall due to : No Fall Risks  Follow up Falls evaluation completed     Most recent depression screenings:    10/16/2023    9:14 AM 09/16/2023    8:46 AM  PHQ 2/9 Scores  PHQ - 2 Score 0 0  PHQ- 9 Score 0     Vision:hasn't seen her eye doctor since covid, needs to make an appt and Dental: No current dental problems, has dentures so doesn't need to go     Patient Care Team: Ozell Heron CHRISTELLA, MD as PCP - General (Family Medicine)   Outpatient Medications Prior to Visit  Medication Sig   albuterol  (VENTOLIN  HFA) 108 (90 Base) MCG/ACT inhaler Inhale 2 puffs into the lungs every 6 (six) hours as needed for wheezing or shortness of breath.   bisoprolol -hydrochlorothiazide  (ZIAC ) 2.5-6.25 MG tablet TAKE 1 TABLET BY MOUTH ONCE DAILY   budeson-glycopyrrolate-formoterol (BREZTRI  AEROSPHERE) 160-9-4.8 MCG/ACT AERO Inhale 2 puffs into the lungs 2 (two) times daily.   Calcium  Carbonate-Vitamin D  (CALTRATE 600+D PO) Take by mouth daily.   levothyroxine  (EUTHYROX ) 88 MCG tablet TAKE 1 TABLET BY MOUTH ONCE DAILY BEFORE BREAKFAST    lisinopril  (ZESTRIL ) 5 MG tablet TAKE 1 TABLET BY MOUTH ONCE DAILY   rosuvastatin  (CRESTOR ) 10 MG tablet TAKE 1 TABLET BY MOUTH ONCE DAILY   No facility-administered medications prior to visit.    Review of Systems  HENT:  Negative for hearing loss.   Eyes:  Negative for blurred vision.  Respiratory:  Negative for shortness of breath.   Cardiovascular:  Negative for chest pain.  Gastrointestinal: Negative.   Genitourinary: Negative.   Musculoskeletal:  Negative for back pain.  Neurological:  Negative for headaches.  Psychiatric/Behavioral:  Negative for depression.        Objective:     BP 112/68   Pulse (!) 55   Temp 98 F (36.7 C) (Oral)   Ht 5' 1.75 (1.568 m)   Wt 187 lb 3.2 oz (84.9 kg)   SpO2 94%   BMI 34.52 kg/m     Physical Exam Vitals reviewed.  Constitutional:      Appearance: Normal appearance. She is well-groomed. She is obese.  HENT:     Right Ear: Tympanic membrane and ear canal normal.     Left Ear: Tympanic membrane and ear canal normal.     Mouth/Throat:     Mouth: Mucous membranes are moist.     Pharynx: No posterior oropharyngeal erythema.  Eyes:  Conjunctiva/sclera: Conjunctivae normal.  Neck:     Thyroid : No thyromegaly.  Cardiovascular:     Rate and Rhythm: Normal rate and regular rhythm.     Pulses: Normal pulses.     Heart sounds: S1 normal and S2 normal.  Pulmonary:     Effort: Pulmonary effort is normal.     Breath sounds: Normal breath sounds and air entry.  Abdominal:     General: Abdomen is flat. Bowel sounds are normal.     Palpations: Abdomen is soft.  Musculoskeletal:     Right lower leg: No edema.     Left lower leg: No edema.  Lymphadenopathy:     Cervical: No cervical adenopathy.  Neurological:     Mental Status: She is alert and oriented to person, place, and time. Mental status is at baseline.     Gait: Gait is intact.  Psychiatric:        Mood and Affect: Mood and affect normal.        Speech: Speech normal.         Behavior: Behavior normal.        Judgment: Judgment normal.      No results found for any visits on 10/16/23.      Assessment & Plan:    Routine Health Maintenance and Physical Exam  Immunization History  Administered Date(s) Administered   Fluad Quad(high Dose 65+) 01/11/2020   Influenza, High Dose Seasonal PF 01/29/2019   Influenza-Unspecified 01/30/2019, 12/29/2020   Moderna Sars-Covid-2 Vaccination 05/05/2019, 06/01/2019, 02/29/2020   Pneumococcal Polysaccharide-23 01/11/2020    Health Maintenance  Topic Date Due   DTaP/Tdap/Td (1 - Tdap) Never done   Zoster Vaccines- Shingrix  (1 of 2) Never done   DEXA SCAN  Never done   Pneumococcal Vaccine: 50+ Years (2 of 2 - PCV) 01/10/2021   INFLUENZA VACCINE  10/30/2023   Medicare Annual Wellness (AWV)  09/15/2024   Hepatitis C Screening  Completed   Hepatitis B Vaccines  Aged Out   HPV VACCINES  Aged Out   Meningococcal B Vaccine  Aged Out   Lung Cancer Screening  Discontinued   MAMMOGRAM  Discontinued   Colonoscopy  Discontinued   COVID-19 Vaccine  Discontinued    Discussed health benefits of physical activity, and encouraged her to engage in regular exercise appropriate for her age and condition.  Hypothyroidism, unspecified type -     TSH; Future  Essential hypertension -     Comprehensive metabolic panel with GFR; Future  Hyperlipidemia, unspecified hyperlipidemia type -     Lipid panel; Future  Diabetes mellitus screening -     Hemoglobin A1c; Future  Routine general medical examination at a health care facility  Postmenopausal state -     DG Bone Density; Future  Normal physical exam findings. I counseled the patient on the recommended amount of exercise per CDC recommendation. I reviewed preventative screening, immunizations, and medical history and updated in the chart, and appropriate labs and vaccinations were ordered. Handouts given on healthy eating and exercise.    Return in 6 months (on  04/17/2024) for HTN.     Heron CHRISTELLA Sharper, MD

## 2023-10-16 NOTE — Patient Instructions (Signed)

## 2023-12-03 DIAGNOSIS — H25813 Combined forms of age-related cataract, bilateral: Secondary | ICD-10-CM | POA: Diagnosis not present

## 2024-09-21 ENCOUNTER — Ambulatory Visit
